# Patient Record
Sex: Female | Born: 1990 | Race: White | Hispanic: No | Marital: Married | Smoking: Never smoker
Health system: Southern US, Community
[De-identification: ages and names within clinical notes are randomized; demographics above are authoritative.]

## PROBLEM LIST (undated history)

## (undated) DIAGNOSIS — Z789 Other specified health status: Secondary | ICD-10-CM

## (undated) DIAGNOSIS — I370 Nonrheumatic pulmonary valve stenosis: Secondary | ICD-10-CM

## (undated) HISTORY — DX: Nonrheumatic pulmonary valve stenosis: I37.0

## (undated) HISTORY — PX: NO PAST SURGERIES: SHX2092

---
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
## 2017-03-11 ENCOUNTER — Encounter: Payer: Self-pay | Admitting: Emergency Medicine

## 2017-03-11 ENCOUNTER — Emergency Department
Admission: EM | Admit: 2017-03-11 | Discharge: 2017-03-11 | Disposition: A | Discharge status: Home / Self Care | Payer: Self-pay | Attending: Emergency Medicine | Admitting: Emergency Medicine

## 2017-03-11 ENCOUNTER — Emergency Department: Payer: Self-pay

## 2017-03-11 DIAGNOSIS — R102 Pelvic and perineal pain unspecified side: Secondary | ICD-10-CM

## 2017-03-11 DIAGNOSIS — N83202 Unspecified ovarian cyst, left side: Secondary | ICD-10-CM

## 2017-03-11 DIAGNOSIS — K529 Noninfective gastroenteritis and colitis, unspecified: Secondary | ICD-10-CM

## 2017-03-11 DIAGNOSIS — N83209 Unspecified ovarian cyst, unspecified side: Secondary | ICD-10-CM

## 2017-03-11 LAB — URINALYSIS, COMPLETE (UACMP) WITH MICROSCOPIC
BACTERIA UA: NONE SEEN
Glucose, UA: NEGATIVE mg/dL
Hgb urine dipstick: NEGATIVE
Ketones, ur: NEGATIVE mg/dL
Leukocytes, UA: NEGATIVE
Nitrite: NEGATIVE
PH: 7 (ref 5.0–8.0)
Specific Gravity, Urine: 1.02 (ref 1.005–1.030)

## 2017-03-11 LAB — CBC WITH DIFFERENTIAL/PLATELET
Basophils Absolute: 0 10*3/uL (ref 0–0.1)
Basophils Relative: 0 %
EOS ABS: 0 10*3/uL (ref 0–0.7)
EOS PCT: 0 %
HCT: 39.1 % (ref 35.0–47.0)
Hemoglobin: 13.6 g/dL (ref 12.0–16.0)
LYMPHS ABS: 0.6 10*3/uL — AB (ref 1.0–3.6)
LYMPHS PCT: 7 %
MCH: 30 pg (ref 26.0–34.0)
MCHC: 34.7 g/dL (ref 32.0–36.0)
MCV: 86.5 fL (ref 80.0–100.0)
MONO ABS: 0.6 10*3/uL (ref 0.2–0.9)
MONOS PCT: 8 %
Neutro Abs: 6.6 10*3/uL — ABNORMAL HIGH (ref 1.4–6.5)
Neutrophils Relative %: 85 %
PLATELETS: 196 10*3/uL (ref 150–440)
RBC: 4.52 MIL/uL (ref 3.80–5.20)
RDW: 13.2 % (ref 11.5–14.5)
WBC: 7.9 10*3/uL (ref 3.6–11.0)

## 2017-03-11 LAB — POCT PREGNANCY, URINE: Preg Test, Ur: NEGATIVE

## 2017-03-11 LAB — COMPREHENSIVE METABOLIC PANEL
ALBUMIN: 4.8 g/dL (ref 3.5–5.0)
ALT: 12 U/L — AB (ref 14–54)
AST: 20 U/L (ref 15–41)
Alkaline Phosphatase: 59 U/L (ref 38–126)
Anion gap: 8 (ref 5–15)
BUN: 10 mg/dL (ref 6–20)
CHLORIDE: 100 mmol/L — AB (ref 101–111)
CO2: 25 mmol/L (ref 22–32)
CREATININE: 0.92 mg/dL (ref 0.44–1.00)
Calcium: 9 mg/dL (ref 8.9–10.3)
GFR calc Af Amer: >60 mL/min (ref >60)
GFR calc non Af Amer: >60 mL/min (ref >60)
GLUCOSE: 125 mg/dL — AB (ref 65–99)
POTASSIUM: 3.1 mmol/L — AB (ref 3.5–5.1)
SODIUM: 133 mmol/L — AB (ref 135–145)
Total Bilirubin: 0.7 mg/dL (ref 0.3–1.2)
Total Protein: 7.6 g/dL (ref 6.5–8.1)

## 2017-03-11 LAB — LACTIC ACID, PLASMA: LACTIC ACID, VENOUS: 1.2 mmol/L (ref 0.5–1.9)

## 2017-03-11 MED ORDER — SODIUM CHLORIDE 0.9 % IV BOLUS (SEPSIS)
1000.0000 mL | Freq: Once | INTRAVENOUS | Status: AC
  Administered 2017-03-11: 1000 mL via INTRAVENOUS

## 2017-03-11 MED ORDER — IOPAMIDOL (ISOVUE-300) INJECTION 61%
30.0000 mL | Freq: Once | INTRAVENOUS | Status: AC
  Administered 2017-03-11: 30 mL via ORAL

## 2017-03-11 MED ORDER — IOPAMIDOL (ISOVUE-300) INJECTION 61%
100.0000 mL | Freq: Once | INTRAVENOUS | Status: AC | PRN
  Administered 2017-03-11: 100 mL via INTRAVENOUS

## 2017-03-11 MED ORDER — MORPHINE SULFATE (PF) 4 MG/ML IV SOLN: 4.0000 mg | Freq: Once | INTRAVENOUS | Status: DC

## 2017-03-11 MED ORDER — ACETAMINOPHEN 325 MG PO TABS
650.0000 mg | ORAL_TABLET | Freq: Once | ORAL | Status: AC
  Administered 2017-03-11: 650 mg via ORAL
  Filled 2017-03-11: qty 2

## 2017-03-11 NOTE — ED Notes (Signed)
Pt c/o abdominal pain and cramping that began last night. Pt denies any urinary symptoms, cough. Pt states pain radiates to bilateral flank area. Last normal BM X 6 days ago then followed by diarrhea. Pt alert and oriented X4, active, cooperative, pt in NAD. RR even and unlabored, color WNL.

## 2017-03-11 NOTE — ED Provider Notes (Addendum)
[facility] Emergency Department Provider Note  ____________________________________________   I have reviewed the triage vital signs and the nursing notes.   HISTORY  Chief Complaint Diarrhea and Abdominal Pain    HPI Sally Hall is a 26 y.o. female who presents today complaining of lower abdominal discomfort that started this morning. Patient states she's had diarrhea for the last 2-3 days, nonbloody and non-melanotic. It was preceded by a few days of constipation. She has had no vomiting, she has had decreased appetite today. She's never had any abdominal surgeries. The pain is diffuse and cramping on both sides. No other radiation. It is a sharp pain. It is similar to her menstrual. Pain but worse. She is on her menses at this time. She denies any vaginal discharge. She states she has had copious diarrhea over the last few days but is getting better today. She did not notice fever until she got here. She has had no URI symptoms nausea or vomiting or other possible sources of fever. She denies dysuria. She denies vaginal discharge.  She declines pain medication. She is otherwise healthy.   History reviewed. No pertinent past medical history.  There are no active problems to display for this patient.   History reviewed. No pertinent surgical history.  Prior to Admission medications   Not on File    Allergies Penicillins  No family history on file.  Social History Social History  Substance Use Topics  . Smoking status: Never Smoker  . Smokeless tobacco: Never Used  . Alcohol use No    Review of Systems Constitutional: A fever here only was afebrile prior to coming in Eyes: No visual changes. ENT: No sore throat. No stiff neck no neck pain Cardiovascular: Denies chest pain. Respiratory: Denies shortness of breath. Gastrointestinal:   no vomiting.  Positive diarrhea see history of present illness Genitourinary: Negative for  dysuria. Musculoskeletal: Negative lower extremity swelling Skin: Negative for rash. Neurological: Negative for severe headaches, focal weakness or numbness.   ____________________________________________   PHYSICAL EXAM:  VITAL SIGNS: ED Triage Vitals [03/11/17 1639]  Enc Vitals Group     BP 115/76     Pulse Rate (!) 128     Resp 18     Temp (!) 102.1 F (38.9 C)     Temp Source Oral     SpO2 100 %     Weight 148 lb (67.1 kg)     Height 5\' 7"  (1.702 m)     Head Circumference      Peak Flow      Pain Score 3     Pain Loc      Pain Edu?      Excl. in GC?     Constitutional: Alert and oriented. Well appearing and in no acute distress. Eyes: Conjunctivae are normal Head: Atraumatic HEENT: No congestion/rhinnorhea. Mucous membranes are moist.  Oropharynx non-erythematous Neck:   Nontender with no meningismus, no masses, no stridor Cardiovascular: Normal rate, regular rhythm. Grossly normal heart sounds.  Good peripheral circulation. Respiratory: Normal respiratory effort.  No retractions. Lungs CTAB. Abdominal: Soft and diffuse lower abdominal discomfort left and right, no guarding no rebound Back:  There is no focal tenderness or step off.  there is no midline tenderness there are no lesions noted. there is no CVA tenderness Musculoskeletal: No lower extremity tenderness, no upper extremity tenderness. No joint effusions, no DVT signs strong distal pulses no edema Neurologic:  Normal speech and language. No gross focal neurologic deficits  are appreciated.  Skin:  Skin is warm, dry and intact. No rash noted. Psychiatric: Mood and affect are normal. Speech and behavior are normal.  ____________________________________________   LABS (all labs ordered are listed, but only abnormal results are displayed)  Labs Reviewed  CBC WITH DIFFERENTIAL/PLATELET - Abnormal; Notable for the following:       Result Value   Neutro Abs 6.6 (*)    Lymphs Abs 0.6 (*)    All other  components within normal limits  LACTIC ACID, PLASMA  LACTIC ACID, PLASMA  COMPREHENSIVE METABOLIC PANEL  URINALYSIS, COMPLETE (UACMP) WITH MICROSCOPIC  POC URINE PREG, ED  POCT PREGNANCY, URINE   ____________________________________________  EKG  I personally interpreted any EKGs ordered by me or triage  ____________________________________________  RADIOLOGY  I reviewed any imaging ordered by me or triage that were performed during my shift and, if possible, patient and/or family made aware of any abnormal findings. ____________________________________________   PROCEDURES  Procedure(s) performed: None  Procedures  Critical Care performed: None  ____________________________________________   INITIAL IMPRESSION / ASSESSMENT AND PLAN / ED COURSE  Pertinent labs & imaging results that were available during my care of the patient were reviewed by me and considered in my medical decision making (see chart for details).  Patient with diarrhea and fever and lower abdominal pain. She is on her menstrual period. She is not pregnant. I did offer her pain medication she'll prefer to decline. We will see what her urine and blood work show which will hopefully help with her differential. Low suspicion for PID given diarrheal and GI symptoms. No recent antibiotics or international travel or camping. Low suspicion for Giardia or C. difficile. We will give her IV fluids continue to monitor her and determine whether imaging is necessary. Patient would prefer to defer pelvic exam at this time  ----------------------------------------- 9:10 PM on 03/11/2017 -----------------------------------------  Abdomen benign. Pain-free at this time. Heart rate is coming down somewhat. Patient states she is very anxious around doctors. She feels this is why her heart rate has been up. We will continue to assess. Patient heart rate this time is in the 80s. We did discuss with her the risks benefits and  alternative to pelvic exam and she declines. She understands there is always a risk that she could've PID which could cause infertility. However she feels that she does not wish to be done in the emergency room and she declines. I also talked about her ovarian cyst. Personally I think this is much more likely to be an incidental finding with a diarrheal illness with a fever however given the size of the cyst, I did order an ultrasound to evaluate for possible torsion. I have very low suspicion for torsion given the gradual onset of pain in the context of diarrhea and fever however 857 m systems of the borderline of risk for that. Patient states she would feel comfortable being further evaluated which we are doing. Without pelvic exam, she knows, does somewhat limit my ability to determine that but she does not have focal left-sided pain she generalized cramping lower abdominal pain with fever and diarrhea.  ----------------------------------------- 10:41 PM on 03/11/2017 -----------------------------------------  Discussed with Dr. Feliberto GottronSchermerhorn who agrees with management and discharge and follow-up.    ____________________________________________   FINAL CLINICAL IMPRESSION(S) / ED DIAGNOSES  Final diagnoses:  None      This chart was dictated using voice recognition software.  Despite best efforts to proofread,  errors can occur which can change  meaning.      Jeanmarie Plant, MD 03/11/17 1739    Jeanmarie Plant, MD 03/11/17 1744    Jeanmarie Plant, MD 03/11/17 2111    Jeanmarie Plant, MD 03/11/17 2242

## 2017-03-11 NOTE — Discharge Instructions (Signed)
At this time, we are reassured by your findings. You have an ovarian cyst is no evidence that there is a torsion. However, we ask you to follow closely with OB to make sure this is a simple cyst and not something else. In addition, without a pelvic exam, there is a limited to what I can diagnose as we discussed. If you feel worse in any way including fever, vomiting, severe abdominal pain, lightheadedness or you have any other concerns please return immediately to the emergency room. Drink plenty of fluids.

## 2017-03-11 NOTE — ED Notes (Signed)
Pt back from CT

## 2017-03-11 NOTE — ED Notes (Signed)
Patient denied Pelvic exam by MD

## 2017-03-11 NOTE — ED Triage Notes (Signed)
Pt with abd pain and diarrhea.

## 2017-07-29 IMAGING — US US PELVIS COMPLETE
1 series · 13 of 25 positions shown · non-contrast
Comparison: CT abdomen/ pelvis earlier this day.

CLINICAL DATA: Pelvic pain for 1 day.  Ovarian cyst on CT.

EXAM:
TRANSABDOMINAL ULTRASOUND OF PELVIS
DOPPLER ULTRASOUND OF OVARIES
TECHNIQUE: Transabdominal ultrasound examination of the pelvis was performed
including evaluation of the uterus, ovaries, adnexal regions, and
pelvic cul-de-sac. Patient refused transvaginal exam.
Color and duplex Doppler ultrasound was utilized to evaluate blood
flow to the ovaries.

[Series 1: us pelvis complete · 0.22mm/px · 13 of 73 slices shown]
[im 1/73]
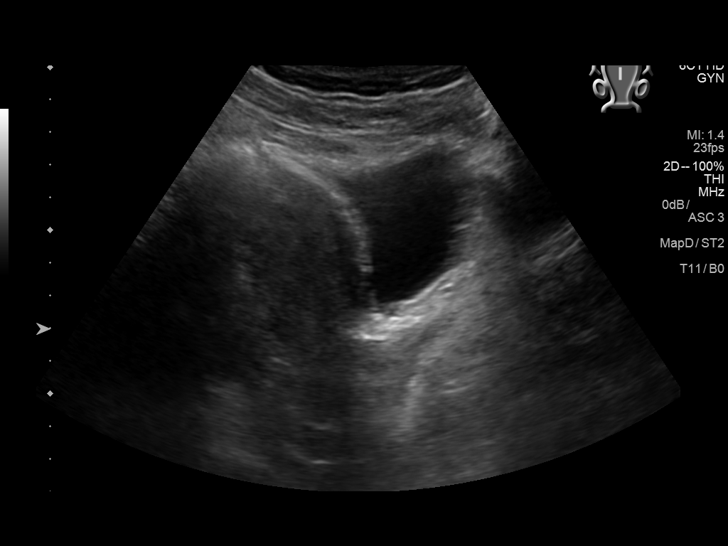
[im 7/73]
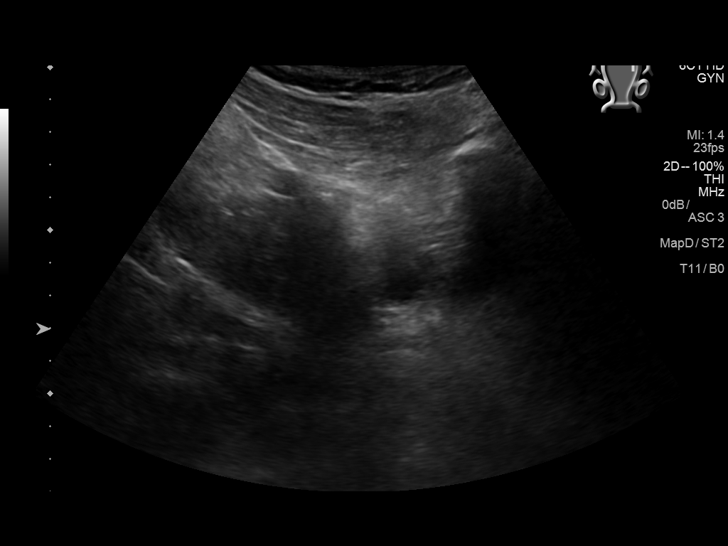
[im 13/73]
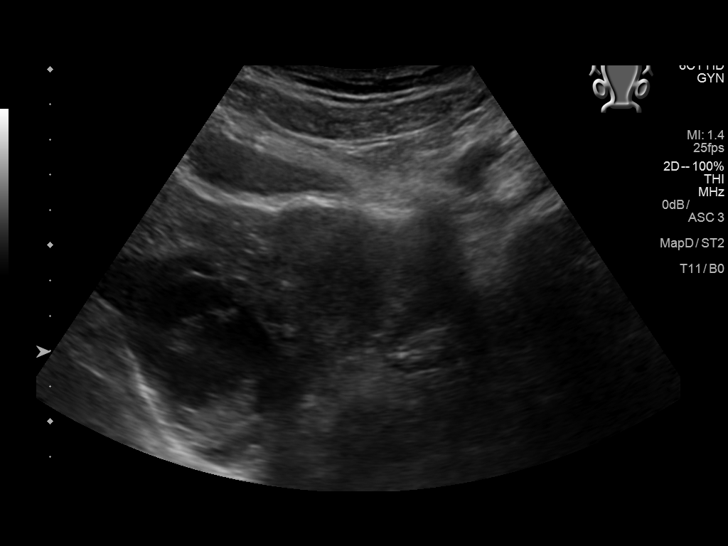
[im 19/73]
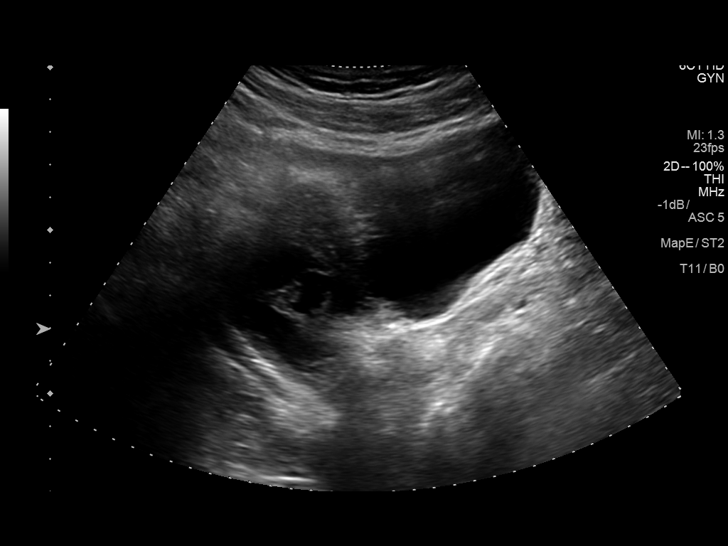
[im 25/73]
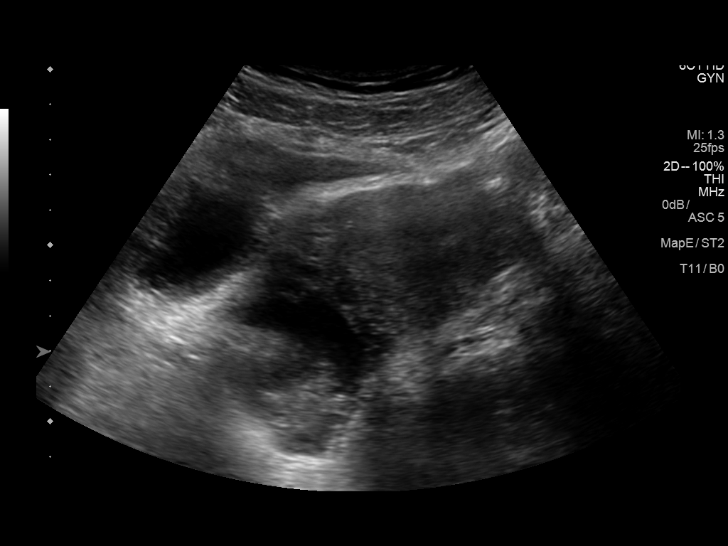
[im 31/73]
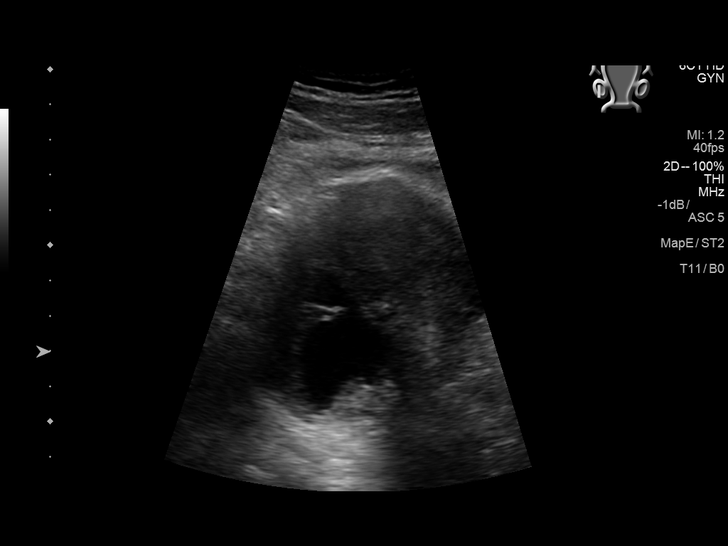
[im 37/73]
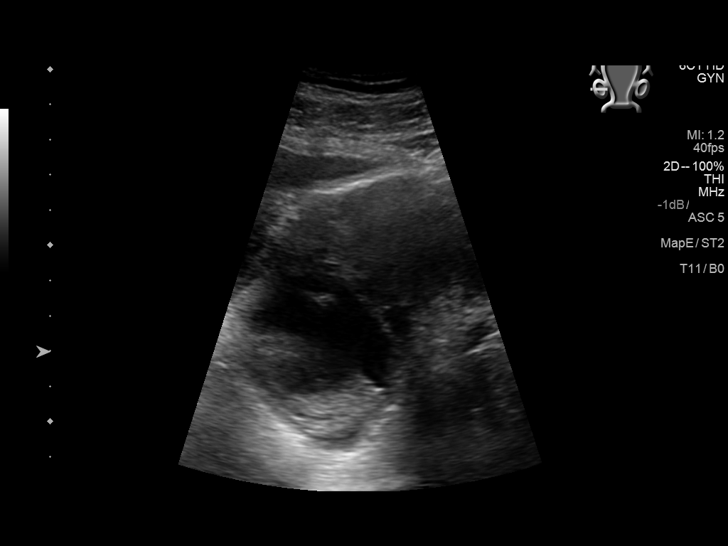
[im 43/73]
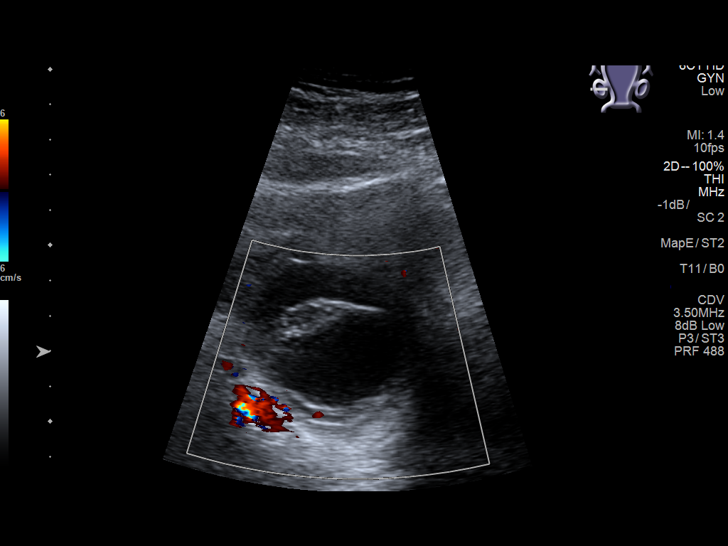
[im 49/73]
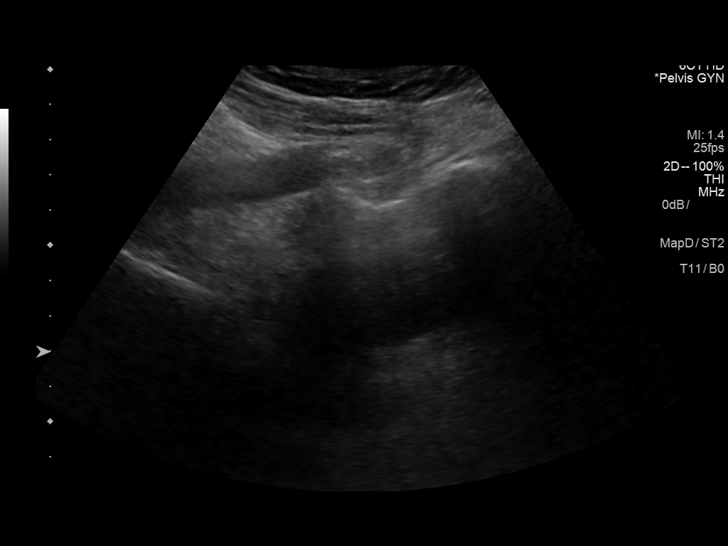
[im 55/73]
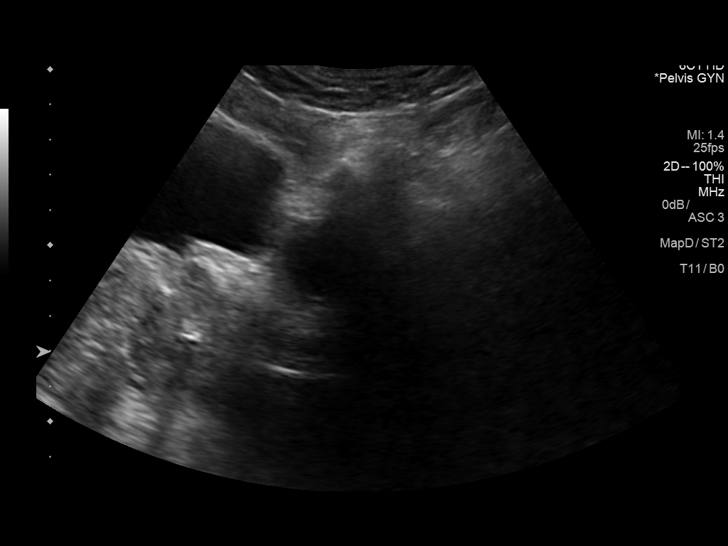
[im 61/73]
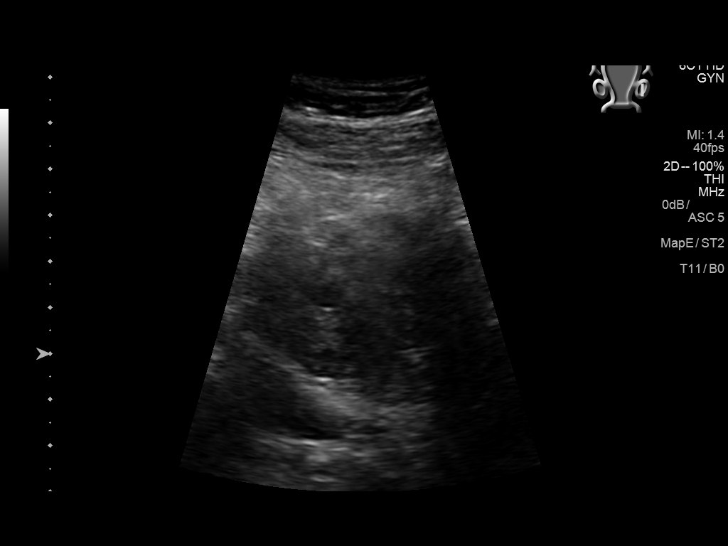
[im 67/73]
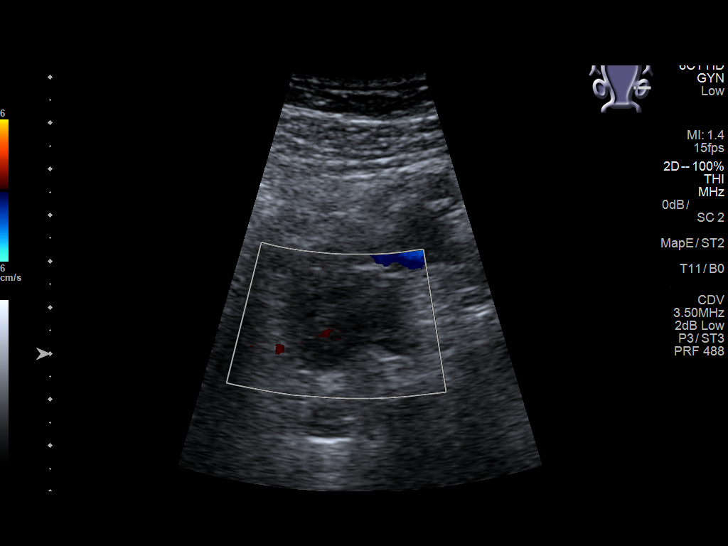
[im 73/73]
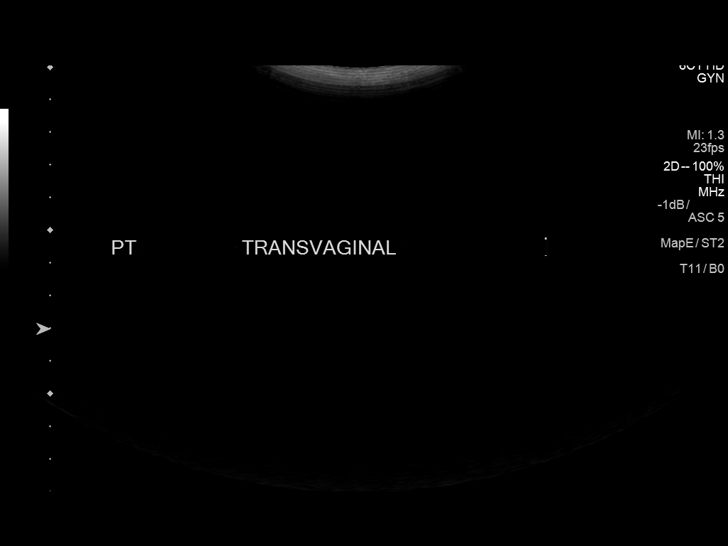

[13 of 25 positions shown; findings below may reference images not displayed]

FINDINGS: Uterus

Measurements: 7.3 x 3.6 x 4.9 cm. No fibroids or other mass
visualized.

Endometrium

Thickness: 8.8 mm, normal. No focal abnormality visualized.

Right ovary

Measurements: 4.2 x 5.5 x 3.8 cm.. There is a complex cyst measuring
3.8 x 4.1 x 4.4 cm, versus multiple adjacent cysts. If 1 lesion
there thick internal septa, no definite septal blood flow. Blood
flow seen to the ovarian parenchyma.

Left ovary

Measurements: 3.2 x 1.8 x 2.0 cm. Normal appearance/no adnexal mass.

Pulsed Doppler evaluation demonstrates normal low-resistance
arterial and venous waveforms in both ovaries.

No pelvic free fluid is visualized.
IMPRESSION: 1. Complex right ovarian cysts versus less likely adjacent cysts
measuring 4.4 cm. There are thick internal septa. Differential
considerations include resolving hemorrhagic cyst with clot
retraction versus ovarian neoplasm. Short-interval follow up
ultrasound in 6-12 weeks is recommended, preferably during the week
following the patient's normal menses.
2. No evidence of torsion.  Normal blood flow to both ovaries.

## 2019-09-23 NOTE — L&D Delivery Note (Signed)
Delivery Note  2230 In room to see patient, wishes to continue pushing. Effective coached maternal pushing efforts in various positions.   Spontaneous vaginal birth of liveborn female infant at 2333 in right occiput anterior position. Single loose nuchal cord reduced on perineum. Infant immediately to maternal abdomen, 10 minutes of delayed cord clamping, skin to skin and three (3) vessel cord. APGARs: 8, 9. Weight pending. Receiving nurse and NNP present at bedside.   IM Pitocin given, see chart. Spontaneous delivery of intact placenta at 2349. First degree perineal laceration repaired with 3-0 vicryl rapide under local anesthesia. Asymmetrical bilateral labial lacerations hemostatic, unrepaired.Uterus firm. Rubra small. EBL: 250 ml.     Initiate routine postpartum care and orders. Mom to postpartum.  Baby to Couplet care / Skin to Skin.  FOB present at bedside and overjoyed with the birth of "Sally Hall".  Routine postpartum care and orders.    Serafina Royals, CNM [facility], [facility] 07/16/2020, 12:19 AM

## 2019-12-19 ENCOUNTER — Ambulatory Visit (INDEPENDENT_AMBULATORY_CARE_PROVIDER_SITE_OTHER): Payer: Self-pay | Admitting: Certified Nurse Midwife

## 2019-12-19 ENCOUNTER — Other Ambulatory Visit: Payer: Self-pay

## 2019-12-19 ENCOUNTER — Encounter: Payer: Self-pay | Admitting: Certified Nurse Midwife

## 2019-12-19 VITALS — BP 116/72 | HR 101 | Ht 67.0 in | Wt 152.1 lb

## 2019-12-19 DIAGNOSIS — N926 Irregular menstruation, unspecified: Secondary | ICD-10-CM

## 2019-12-19 LAB — POCT URINE PREGNANCY: Preg Test, Ur: POSITIVE — AB

## 2019-12-19 NOTE — Patient Instructions (Signed)

## 2019-12-19 NOTE — Progress Notes (Signed)
Subjective:    Sally Hall is a 29 y.o. female who presents for evaluation of amenorrhea. She believes she could be pregnant. Pregnancy is desired. Sexual Activity: single partner, contraception: none. Current symptoms also include: nausea and gas. Last period was normal.   Patient's last menstrual period was 10/10/2019 (exact date). The following portions of the patient's history were reviewed and updated as appropriate: allergies, current medications, past family history, past medical history, past social history, past surgical history and problem list.  Review of Systems Pertinent items are noted in HPI.     Objective:    BP 116/72   Pulse (!) 101   Ht 5\' 7"  (1.702 m)   Wt 152 lb 2 oz (69 kg)   LMP 10/10/2019 (Exact Date)   BMI 23.83 kg/m  General: alert, cooperative, appears stated age and no acute distress    Lab Review Urine HCG: positive    Assessment:    Absence of menstruation.     Plan:   Positive: EDC: 10/02/20. Briefly discussed pre-natal care options. MD pr Midwifery care discussed.  Encouraged well-balanced diet, plenty of rest when needed, pre-natal vitamins daily and walking for exercise. Discussed self-help for nausea, avoiding OTC medications until consulting provider or pharmacist, other than Tylenol as needed, minimal caffeine (1-2 cups daily) and avoiding alcohol. She will schedule her dating u/s 1 wk, nurse visit @ [redacted] wks pregnant and her initial NOB visit @ 12-[redacted] wks pregnant.  Feel free to call with any questions.   11/30/20, CNM

## 2019-12-22 ENCOUNTER — Encounter: Payer: Self-pay | Admitting: Obstetrics and Gynecology

## 2019-12-22 ENCOUNTER — Other Ambulatory Visit: Payer: Self-pay

## 2019-12-22 ENCOUNTER — Ambulatory Visit: Payer: Self-pay

## 2019-12-22 DIAGNOSIS — Z3A11 11 weeks gestation of pregnancy: Secondary | ICD-10-CM

## 2019-12-22 DIAGNOSIS — Z3689 Encounter for other specified antenatal screening: Secondary | ICD-10-CM

## 2019-12-22 DIAGNOSIS — N926 Irregular menstruation, unspecified: Secondary | ICD-10-CM

## 2019-12-30 ENCOUNTER — Ambulatory Visit (INDEPENDENT_AMBULATORY_CARE_PROVIDER_SITE_OTHER): Payer: Self-pay | Admitting: Certified Nurse Midwife

## 2019-12-30 ENCOUNTER — Other Ambulatory Visit: Payer: Self-pay

## 2019-12-30 VITALS — BP 123/75 | HR 118 | Ht 67.0 in | Wt 152.8 lb

## 2019-12-30 DIAGNOSIS — Z0283 Encounter for blood-alcohol and blood-drug test: Secondary | ICD-10-CM

## 2019-12-30 DIAGNOSIS — Z3401 Encounter for supervision of normal first pregnancy, first trimester: Secondary | ICD-10-CM

## 2019-12-30 DIAGNOSIS — Z113 Encounter for screening for infections with a predominantly sexual mode of transmission: Secondary | ICD-10-CM

## 2019-12-30 LAB — OB RESULTS CONSOLE GC/CHLAMYDIA: Gonorrhea: NEGATIVE

## 2019-12-30 LAB — OB RESULTS CONSOLE VARICELLA ZOSTER ANTIBODY, IGG: Varicella: IMMUNE

## 2019-12-30 NOTE — Patient Instructions (Signed)
WHAT OB PATIENTS CAN EXPECT   Confirmation of pregnancy and ultrasound ordered if medically indicated-[redacted] weeks gestation  New OB (NOB) intake with nurse and New OB (NOB) labs- [redacted] weeks gestation  New OB (NOB) physical examination with provider- 11/[redacted] weeks gestation  Flu vaccine-[redacted] weeks gestation  Anatomy scan-[redacted] weeks gestation  Glucose tolerance test, blood work to test for anemia, T-dap vaccine-[redacted] weeks gestation  Vaginal swabs/cultures-STD/Group B strep-[redacted] weeks gestation  Appointments every 4 weeks until 28 weeks  Every 2 weeks from 28 weeks until 36 weeks  Weekly visits from 36 weeks until delivery  Second Trimester of Pregnancy  The second trimester is from week 14 through week 27 (month 4 through 6). This is often the time in pregnancy that you feel your best. Often times, morning sickness has lessened or quit. You may have more energy, and you may get hungry more often. Your unborn baby is growing rapidly. At the end of the sixth month, he or she is about 9 inches long and weighs about 1 pounds. You will likely feel the baby move between 18 and 20 weeks of pregnancy. Follow these instructions at home: Medicines  Take over-the-counter and prescription medicines only as told by your doctor. Some medicines are safe and some medicines are not safe during pregnancy.  Take a prenatal vitamin that contains at least 600 micrograms (mcg) of folic acid.  If you have trouble pooping (constipation), take medicine that will make your stool soft (stool softener) if your doctor approves. Eating and drinking   Eat regular, healthy meals.  Avoid raw meat and uncooked cheese.  If you get low calcium from the food you eat, talk to your doctor about taking a daily calcium supplement.  Avoid foods that are high in fat and sugars, such as fried and sweet foods.  If you feel sick to your stomach (nauseous) or throw up (vomit): ? Eat 4 or 5 small meals a day instead of 3 large  meals. ? Try eating a few soda crackers. ? Drink liquids between meals instead of during meals.  To prevent constipation: ? Eat foods that are high in fiber, like fresh fruits and vegetables, whole grains, and beans. ? Drink enough fluids to keep your pee (urine) clear or pale yellow. Activity  Exercise only as told by your doctor. Stop exercising if you start to have cramps.  Do not exercise if it is too hot, too humid, or if you are in a place of great height (high altitude).  Avoid heavy lifting.  Wear low-heeled shoes. Sit and stand up straight.  You can continue to have sex unless your doctor tells you not to. Relieving pain and discomfort  Wear a good support bra if your breasts are tender.  Take warm water baths (sitz baths) to soothe pain or discomfort caused by hemorrhoids. Use hemorrhoid cream if your doctor approves.  Rest with your legs raised if you have leg cramps or low back pain.  If you develop puffy, bulging veins (varicose veins) in your legs: ? Wear support hose or compression stockings as told by your doctor. ? Raise (elevate) your feet for 15 minutes, 3-4 times a day. ? Limit salt in your food. Prenatal care  Write down your questions. Take them to your prenatal visits.  Keep all your prenatal visits as told by your doctor. This is important. Safety  Wear your seat belt when driving.  Make a list of emergency phone numbers, including numbers for family, friends, the  hospital, and police and fire departments. General instructions  Ask your doctor about the right foods to eat or for help finding a counselor, if you need these services.  Ask your doctor about local prenatal classes. Begin classes before month 6 of your pregnancy.  Do not use hot tubs, steam rooms, or saunas.  Do not douche or use tampons or scented sanitary pads.  Do not cross your legs for long periods of time.  Visit your dentist if you have not done so. Use a soft toothbrush  to brush your teeth. Floss gently.  Avoid all smoking, herbs, and alcohol. Avoid drugs that are not approved by your doctor.  Do not use any products that contain nicotine or tobacco, such as cigarettes and e-cigarettes. If you need help quitting, ask your doctor.  Avoid cat litter boxes and soil used by cats. These carry germs that can cause birth defects in the baby and can cause a loss of your baby (miscarriage) or stillbirth. Contact a doctor if:  You have mild cramps or pressure in your lower belly.  You have pain when you pee (urinate).  You have bad smelling fluid coming from your vagina.  You continue to feel sick to your stomach (nauseous), throw up (vomit), or have watery poop (diarrhea).  You have a nagging pain in your belly area.  You feel dizzy. Get help right away if:  You have a fever.  You are leaking fluid from your vagina.  You have spotting or bleeding from your vagina.  You have severe belly cramping or pain.  You lose or gain weight rapidly.  You have trouble catching your breath and have chest pain.  You notice sudden or extreme puffiness (swelling) of your face, hands, ankles, feet, or legs.  You have not felt the baby move in over an hour.  You have severe headaches that do not go away when you take medicine.  You have trouble seeing. Summary  The second trimester is from week 14 through week 27 (months 4 through 6). This is often the time in pregnancy that you feel your best.  To take care of yourself and your unborn baby, you will need to eat healthy meals, take medicines only if your doctor tells you to do so, and do activities that are safe for you and your baby.  Call your doctor if you get sick or if you notice anything unusual about your pregnancy. Also, call your doctor if you need help with the right food to eat, or if you want to know what activities are safe for you. This information is not intended to replace advice given to you by  your health care provider. Make sure you discuss any questions you have with your health care provider. Document Revised: 12/31/2018 Document Reviewed: 10/14/2016 Elsevier Patient Education  2020 [employer]. Prenatal Care Prenatal care is health care during pregnancy. It helps you and your unborn baby (fetus) stay as healthy as possible. Prenatal care may be provided by a midwife, a family practice health care provider, or a childbirth and pregnancy specialist (obstetrician). How does this affect me? During pregnancy, you will be closely monitored for any new conditions that might develop. To lower your risk of pregnancy complications, you and your health care provider will talk about any underlying conditions you have. How does this affect my baby? Early and consistent prenatal care increases the chance that your baby will be healthy during pregnancy. Prenatal care lowers the risk that your  baby will be:  Born early (prematurely).  Smaller than expected at birth (small for gestational age). What can I expect at the first prenatal care visit? Your first prenatal care visit will likely be the longest. You should schedule your first prenatal care visit as soon as you know that you are pregnant. Your first visit is a good time to talk about any questions or concerns you have about pregnancy. At your visit, you and your health care provider will talk about:  Your medical history, including: ? Any past pregnancies. ? Your family's medical history. ? The baby's father's medical history. ? Any long-term (chronic) health conditions you have and how you manage them. ? Any surgeries or procedures you have had. ? Any current over-the-counter or prescription medicines, herbs, or supplements you are taking.  Other factors that could pose a risk to your baby, including:  Your home setting and your stress levels, including: ? Exposure to abuse or violence. ? Household financial strain. ? Mental  health conditions you have.  Your daily health habits, including diet and exercise. Your health care provider will also:  Measure your weight, height, and blood pressure.  Do a physical exam, including a pelvic and breast exam.  Perform blood tests and urine tests to check for: ? Urinary tract infection. ? Sexually transmitted infections (STIs). ? Low iron levels in your blood (anemia). ? Blood type and certain proteins on red blood cells (Rh antibodies). ? Infections and immunity to viruses, such as hepatitis B and rubella. ? HIV (human immunodeficiency virus).  Do an ultrasound to confirm your baby's growth and development and to help predict your estimated due date (EDD). This ultrasound is done with a probe that is inserted into the vagina (transvaginal ultrasound).  Discuss your options for genetic screening.  Give you information about how to keep yourself and your baby healthy, including: ? Nutrition and taking vitamins. ? Physical activity. ? How to manage pregnancy symptoms such as nausea and vomiting (morning sickness). ? Infections and substances that may be harmful to your baby and how to avoid them. ? Food safety. ? Dental care. ? Working. ? Travel. ? Warning signs to watch for and when to call your health care provider. How often will I have prenatal care visits? After your first prenatal care visit, you will have regular visits throughout your pregnancy. The visit schedule is often as follows:  Up to week 28 of pregnancy: once every 4 weeks.  28-36 weeks: once every 2 weeks.  After 36 weeks: every week until delivery. Some women may have visits more or less often depending on any underlying health conditions and the health of the baby. Keep all follow-up and prenatal care visits as told by your health care provider. This is important. What happens during routine prenatal care visits? Your health care provider will:  Measure your weight and blood  pressure.  Check for fetal heart sounds.  Measure the height of your uterus in your abdomen (fundal height). This may be measured starting around week 20 of pregnancy.  Check the position of your baby inside your uterus.  Ask questions about your diet, sleeping patterns, and whether you can feel the baby move.  Review warning signs to watch for and signs of labor.  Ask about any pregnancy symptoms you are having and how you are dealing with them. Symptoms may include: ? Headaches. ? Nausea and vomiting. ? Vaginal discharge. ? Swelling. ? Fatigue. ? Constipation. ? Any discomfort, including back  or pelvic pain. Make a list of questions to ask your health care provider at your routine visits. What tests might I have during prenatal care visits? You may have blood, urine, and imaging tests throughout your pregnancy, such as:  Urine tests to check for glucose, protein, or signs of infection.  Glucose tests to check for a form of diabetes that can develop during pregnancy (gestational diabetes mellitus). This is usually done around week 24 of pregnancy.  An ultrasound to check your baby's growth and development and to check for birth defects. This is usually done around week 20 of pregnancy.  A test to check for group B strep (GBS) infection. This is usually done around week 36 of pregnancy.  Genetic testing. This may include blood or imaging tests, such as an ultrasound. Some genetic tests are done during the first trimester and some are done during the second trimester. What else can I expect during prenatal care visits? Your health care provider may recommend getting certain vaccines during pregnancy. These may include:  A yearly flu shot (annual influenza vaccine). This is especially important if you will be pregnant during flu season.  Tdap (tetanus, diphtheria, pertussis) vaccine. Getting this vaccine during pregnancy can protect your baby from whooping cough (pertussis) after  birth. This vaccine may be recommended between weeks 27 and 36 of pregnancy. Later in your pregnancy, your health care provider may give you information about:  Childbirth and breastfeeding classes.  Choosing a health care provider for your baby.  Umbilical cord banking.  Breastfeeding.  Birth control after your baby is born.  The hospital labor and delivery unit and how to tour it.  Registering at the hospital before you go into labor. Where to find more information  Office on Women's Health: LegalWarrants.gl  American Pregnancy Association: americanpregnancy.org  March of Dimes: marchofdimes.org Summary  Prenatal care helps you and your baby stay as healthy as possible during pregnancy.  Your first prenatal care visit will most likely be the longest.  You will have visits and tests throughout your pregnancy to monitor your health and your baby's health.  Bring a list of questions to your visits to ask your health care provider.  Make sure to keep all follow-up and prenatal care visits with your health care provider. This information is not intended to replace advice given to you by your health care provider. Make sure you discuss any questions you have with your health care provider. Document Revised: 12/29/2018 Document Reviewed: 09/07/2017 Elsevier Patient Education  2020 [employer]. How a Baby Grows During Pregnancy  Pregnancy begins when a female's sperm enters a female's egg (fertilization). Fertilization usually happens in one of the tubes (fallopian tubes) that connect the ovaries to the womb (uterus). The fertilized egg moves down the fallopian tube to the uterus. Once it reaches the uterus, it implants into the lining of the uterus and begins to grow. For the first 10 weeks, the fertilized egg is called an embryo. After 10 weeks, it is called a fetus. As the fetus continues to grow, it receives oxygen and nutrients through tissue (placenta) that grows to support  the developing baby. The placenta is the life support system for the baby. It provides oxygen and nutrition and removes waste. Learning as much as you can about your pregnancy and how your baby is developing can help you enjoy the experience. It can also make you aware of when there might be a problem and when to ask questions. How long  does a typical pregnancy last? A pregnancy usually lasts 280 days, or about 40 weeks. Pregnancy is divided into three periods of growth, also called trimesters:  First trimester: 0-12 weeks.  Second trimester: 13-27 weeks.  Third trimester: 28-40 weeks. The day when your baby is ready to be born (full term) is your estimated date of delivery. How does my baby develop month by month? First month  The fertilized egg attaches to the inside of the uterus.  Some cells will form the placenta. Others will form the fetus.  The arms, legs, brain, spinal cord, lungs, and heart begin to develop.  At the end of the first month, the heart begins to beat. Second month  The bones, inner ear, eyelids, hands, and feet form.  The genitals develop.  By the end of 8 weeks, all major organs are developing. Third month  All of the internal organs are forming.  Teeth develop below the gums.  Bones and muscles begin to grow. The spine can flex.  The skin is transparent.  Fingernails and toenails begin to form.  Arms and legs continue to grow longer, and hands and feet develop.  The fetus is about 3 inches (7.6 cm) long. Fourth month  The placenta is completely formed.  The external sex organs, neck, outer ear, eyebrows, eyelids, and fingernails are formed.  The fetus can hear, swallow, and move its arms and legs.  The kidneys begin to produce urine.  The skin is covered with a white, waxy coating (vernix) and very fine hair (lanugo). Fifth month  The fetus moves around more and can be felt for the first time (quickening).  The fetus starts to sleep  and wake up and may begin to suck its finger.  The nails grow to the end of the fingers.  The organ in the digestive system that makes bile (gallbladder) functions and helps to digest nutrients.  If your baby is a girl, eggs are present in her ovaries. If your baby is a boy, testicles start to move down into his scrotum. Sixth month  The lungs are formed.  The eyes open. The brain continues to develop.  Your baby has fingerprints and toe prints. Your baby's hair grows thicker.  At the end of the second trimester, the fetus is about 9 inches (22.9 cm) long. Seventh month  The fetus kicks and stretches.  The eyes are developed enough to sense changes in light.  The hands can make a grasping motion.  The fetus responds to sound. Eighth month  All organs and body systems are fully developed and functioning.  Bones harden, and taste buds develop. The fetus may hiccup.  Certain areas of the brain are still developing. The skull remains soft. Ninth month  The fetus gains about  lb (0.23 kg) each week.  The lungs are fully developed.  Patterns of sleep develop.  The fetus's head typically moves into a head-down position (vertex) in the uterus to prepare for birth.  The fetus weighs 6-9 lb (2.72-4.08 kg) and is 19-20 inches (48.26-50.8 cm) long. What can I do to have a healthy pregnancy and help my baby develop? General instructions  Take prenatal vitamins as directed by your health care provider. These include vitamins such as folic acid, iron, calcium, and vitamin D. They are important for healthy development.  Take medicines only as directed by your health care provider. Read labels and ask a pharmacist or your health care provider whether over-the-counter medicines, supplements, and prescription drugs  are safe to take during pregnancy.  Keep all follow-up visits as directed by your health care provider. This is important. Follow-up visits include prenatal care and  screening tests. How do I know if my baby is developing well? At each prenatal visit, your health care provider will do several different tests to check on your health and keep track of your baby's development. These include:  Fundal height and position. ? Your health care provider will measure your growing belly from your pubic bone to the top of the uterus using a tape measure. ? Your health care provider will also feel your belly to determine your baby's position.  Heartbeat. ? An ultrasound in the first trimester can confirm pregnancy and show a heartbeat, depending on how far along you are. ? Your health care provider will check your baby's heart rate at every prenatal visit.  Second trimester ultrasound. ? This ultrasound checks your baby's development. It also may show your baby's gender. What should I do if I have concerns about my baby's development? Always talk with your health care provider about any concerns that you may have about your pregnancy and your baby. Summary  A pregnancy usually lasts 280 days, or about 40 weeks. Pregnancy is divided into three periods of growth, also called trimesters.  Your health care provider will monitor your baby's growth and development throughout your pregnancy.  Follow your health care provider's recommendations about taking prenatal vitamins and medicines during your pregnancy.  Talk with your health care provider if you have any concerns about your pregnancy or your developing baby. This information is not intended to replace advice given to you by your health care provider. Make sure you discuss any questions you have with your health care provider. Document Revised: 12/30/2018 Document Reviewed: 07/22/2017 Elsevier Patient Education  2020 [place] of Pregnancy  The first trimester of pregnancy is from week 1 until the end of week 13 (months 1 through 3). During this time, your baby will begin to develop inside  you. At 6-8 weeks, the eyes and face are formed, and the heartbeat can be seen on ultrasound. At the end of 12 weeks, all the baby's organs are formed. Prenatal care is all the medical care you receive before the birth of your baby. Make sure you get good prenatal care and follow all of your doctor's instructions. Follow these instructions at home: Medicines  Take over-the-counter and prescription medicines only as told by your doctor. Some medicines are safe and some medicines are not safe during pregnancy.  Take a prenatal vitamin that contains at least 600 micrograms (mcg) of folic acid.  If you have trouble pooping (constipation), take medicine that will make your stool soft (stool softener) if your doctor approves. Eating and drinking   Eat regular, healthy meals.  Your doctor will tell you the amount of weight gain that is right for you.  Avoid raw meat and uncooked cheese.  If you feel sick to your stomach (nauseous) or throw up (vomit): ? Eat 4 or 5 small meals a day instead of 3 large meals. ? Try eating a few soda crackers. ? Drink liquids between meals instead of during meals.  To prevent constipation: ? Eat foods that are high in fiber, like fresh fruits and vegetables, whole grains, and beans. ? Drink enough fluids to keep your pee (urine) clear or pale yellow. Activity  Exercise only as told by your doctor. Stop exercising if you have cramps or pain  in your lower belly (abdomen) or low back.  Do not exercise if it is too hot, too humid, or if you are in a place of great height (high altitude).  Try to avoid standing for long periods of time. Move your legs often if you must stand in one place for a long time.  Avoid heavy lifting.  Wear low-heeled shoes. Sit and stand up straight.  You can have sex unless your doctor tells you not to. Relieving pain and discomfort  Wear a good support bra if your breasts are sore.  Take warm water baths (sitz baths) to soothe  pain or discomfort caused by hemorrhoids. Use hemorrhoid cream if your doctor says it is okay.  Rest with your legs raised if you have leg cramps or low back pain.  If you have puffy, bulging veins (varicose veins) in your legs: ? Wear support hose or compression stockings as told by your doctor. ? Raise (elevate) your feet for 15 minutes, 3-4 times a day. ? Limit salt in your food. Prenatal care  Schedule your prenatal visits by the twelfth week of pregnancy.  Write down your questions. Take them to your prenatal visits.  Keep all your prenatal visits as told by your doctor. This is important. Safety  Wear your seat belt at all times when driving.  Make a list of emergency phone numbers. The list should include numbers for family, friends, the hospital, and police and fire departments. General instructions  Ask your doctor for a referral to a local prenatal class. Begin classes no later than at the start of month 6 of your pregnancy.  Ask for help if you need counseling or if you need help with nutrition. Your doctor can give you advice or tell you where to go for help.  Do not use hot tubs, steam rooms, or saunas.  Do not douche or use tampons or scented sanitary pads.  Do not cross your legs for long periods of time.  Avoid all herbs and alcohol. Avoid drugs that are not approved by your doctor.  Do not use any tobacco products, including cigarettes, chewing tobacco, and electronic cigarettes. If you need help quitting, ask your doctor. You may get counseling or other support to help you quit.  Avoid cat litter boxes and soil used by cats. These carry germs that can cause birth defects in the baby and can cause a loss of your baby (miscarriage) or stillbirth.  Visit your dentist. At home, brush your teeth with a soft toothbrush. Be gentle when you floss. Contact a doctor if:  You are dizzy.  You have mild cramps or pressure in your lower belly.  You have a nagging pain  in your belly area.  You continue to feel sick to your stomach, you throw up, or you have watery poop (diarrhea).  You have a bad smelling fluid coming from your vagina.  You have pain when you pee (urinate).  You have increased puffiness (swelling) in your face, hands, legs, or ankles. Get help right away if:  You have a fever.  You are leaking fluid from your vagina.  You have spotting or bleeding from your vagina.  You have very bad belly cramping or pain.  You gain or lose weight rapidly.  You throw up blood. It may look like coffee grounds.  You are around people who have [place] measles, fifth disease, or chickenpox.  You have a very bad headache.  You have shortness of breath.  You  have any kind of trauma, such as from a fall or a car accident. Summary  The first trimester of pregnancy is from week 1 until the end of week 13 (months 1 through 3).  To take care of yourself and your unborn baby, you will need to eat healthy meals, take medicines only if your doctor tells you to do so, and do activities that are safe for you and your baby.  Keep all follow-up visits as told by your doctor. This is important as your doctor will have to ensure that your baby is healthy and growing well. This information is not intended to replace advice given to you by your health care provider. Make sure you discuss any questions you have with your health care provider. Document Revised: 12/30/2018 Document Reviewed: 09/16/2016 Elsevier Patient Education  2020 [employer]. Commonly Asked Questions During Pregnancy  Cats: A parasite can be excreted in cat feces.  To avoid exposure you need to have another person empty the little box.  If you must empty the litter box you will need to wear gloves.  Wash your hands after handling your cat.  This parasite can also be found in raw or undercooked meat so this should also be avoided.  Colds, Sore Throats, Flu: Please check your medication  sheet to see what you can take for symptoms.  If your symptoms are unrelieved by these medications please call the office.  Dental Work: Most any dental work Investment banker, corporate recommends is permitted.  X-rays should only be taken during the first trimester if absolutely necessary.  Your abdomen should be shielded with a lead apron during all x-rays.  Please notify your provider prior to receiving any x-rays.  Novocaine is fine; gas is not recommended.  If your dentist requires a note from [place] prior to dental work please call the office and we will provide one for you.  Exercise: Exercise is an important part of staying healthy during your pregnancy.  You may continue most exercises you were accustomed to prior to pregnancy.  Later in your pregnancy you will most likely notice you have difficulty with activities requiring balance like riding a bicycle.  It is important that you listen to your body and avoid activities that put you at a higher risk of falling.  Adequate rest and staying well hydrated are a must!  If you have questions about the safety of specific activities ask your provider.    Exposure to Children with illness: Try to avoid obvious exposure; report any symptoms to [place] when noted,  If you have chicken pos, red measles or mumps, you should be immune to these diseases.   Please do not take any vaccines while pregnant unless you have checked with your OB provider.  Fetal Movement: After 28 weeks we recommend you do "kick counts" twice daily.  Lie or sit down in a calm quiet environment and count your baby movements "kicks".  You should feel your baby at least 10 times per hour.  If you have not felt 10 kicks within the first hour get up, walk around and have something sweet to eat or drink then repeat for an additional hour.  If count remains less than 10 per hour notify your provider.  Fumigating: Follow your pest control agent's advice as to how long to stay out of your home.  Ventilate the area  well before re-entering.  Hemorrhoids:   Most over-the-counter preparations can be used during pregnancy.  Check your medication to  see what is safe to use.  It is important to use a stool softener or fiber in your diet and to drink lots of liquids.  If hemorrhoids seem to be getting worse please call the office.   Hot Tubs:  Hot tubs Jacuzzis and saunas are not recommended while pregnant.  These increase your internal body temperature and should be avoided.  Intercourse:  Sexual intercourse is safe during pregnancy as long as you are comfortable, unless otherwise advised by your provider.  Spotting may occur after intercourse; report any bright red bleeding that is heavier than spotting.  Labor:  If you know that you are in labor, please go to the hospital.  If you are unsure, please call the office and let us help you decide what to do.  Lifting, straining, etc:  If your job requires heavy lifting or straining please check with your provider for any limitations.  Generally, you should not lift items heavier than that you can lift simply with your hands and arms (no back muscles)  Painting:  Paint fumes do not harm your pregnancy, but may make you ill and should be avoided if possible.  Latex or water based paints have less odor than oils.  Use adequate ventilation while painting.  Permanents & Hair Color:  Chemicals in hair dyes are not recommended as they cause increase hair dryness which can increase hair loss during pregnancy.  " Highlighting" and permanents are allowed.  Dye may be absorbed differently and permanents may not hold as well during pregnancy.  Sunbathing:  Use a sunscreen, as skin burns easily during pregnancy.  Drink plenty of fluids; avoid over heating.  Tanning Beds:  Because their possible side effects are still unknown, tanning beds are not recommended.  Ultrasound Scans:  Routine ultrasounds are performed at approximately 20 weeks.  You will be able to see your baby's  general anatomy an if you would like to know the gender this can usually be determined as well.  If it is questionable when you conceived you may also receive an ultrasound early in your pregnancy for dating purposes.  Otherwise ultrasound exams are not routinely performed unless there is a medical necessity.  Although you can request a scan we ask that you pay for it when conducted because insurance does not cover " patient request" scans.  Work: If your pregnancy proceeds without complications you may work until your due date, unless your physician or employer advises otherwise.  Round Ligament Pain/Pelvic Discomfort:  Sharp, shooting pains not associated with bleeding are fairly common, usually occurring in the second trimester of pregnancy.  They tend to be worse when standing up or when you remain standing for long periods of time.  These are the result of pressure of certain pelvic ligaments called "round ligaments".  Rest, Tylenol and heat seem to be the most effective relief.  As the womb and fetus grow, they rise out of the pelvis and the discomfort improves.  Please notify the office if your pain seems different than that described.  It may represent a more serious condition.  Common Medications Safe in Pregnancy  Acne:      Constipation:  Benzoyl Peroxide     Colace  Clindamycin      Dulcolax Suppository  Topica Erythromycin     Fibercon  Salicylic Acid      Metamucil         Miralax AVOID:        Senakot   Accutane  Cough:  Retin-A       Cough Drops  Tetracycline      Phenergan w/ Codeine if Rx  Minocycline      Robitussin (Plain & DM)  Antibiotics:     Crabs/Lice:  Ceclor       RID  Cephalosporins    AVOID:  E-Mycins      Kwell  Keflex  Macrobid/Macrodantin   Diarrhea:  Penicillin      Kao-Pectate  Zithromax      Imodium AD         PUSH FLUIDS AVOID:       Cipro     Fever:  Tetracycline      Tylenol (Regular or Extra  Minocycline       Strength)  Levaquin      Extra  Strength-Do not          Exceed 8 tabs/24 hrs Caffeine:        '200mg'$ /day (equiv. To 1 cup of coffee or  approx. 3 12 oz sodas)         Gas: Cold/Hayfever:       Gas-X  Benadryl      Mylicon  Claritin       Phazyme  **Claritin-D        Chlor-Trimeton    Headaches:  Dimetapp      ASA-Free Excedrin  Drixoral-Non-Drowsy     Cold Compress  Mucinex (Guaifenasin)     Tylenol (Regular or Extra  Sudafed/Sudafed-12 Hour     Strength)  **Sudafed PE Pseudoephedrine   Tylenol Cold & Sinus     Vicks Vapor Rub  Zyrtec  **AVOID if Problems With Blood Pressure         Heartburn: Avoid lying down for at least 1 hour after meals  Aciphex      Maalox     Rash:  Milk of Magnesia     Benadryl    Mylanta       1% Hydrocortisone Cream  Pepcid  Pepcid Complete   Sleep Aids:  Prevacid      Ambien   Prilosec       Benadryl  Rolaids       Chamomile Tea  Tums (Limit 4/day)     Unisom  Zantac       Tylenol PM         Warm milk-add vanilla or  Hemorrhoids:       Sugar for taste  Anusol/Anusol H.C.  (RX: Analapram 2.5%)  Sugar Substitutes:  Hydrocortisone OTC     Ok in moderation  Preparation H      Tucks        Vaseline lotion applied to tissue with wiping    Herpes:     Throat:  Acyclovir      Oragel  Famvir  Valtrex     Vaccines:         Flu Shot Leg Cramps:       *Gardasil  Benadryl      Hepatitis A         Hepatitis B Nasal Spray:       Pneumovax  Saline Nasal Spray     Polio Booster         Tetanus Nausea:       Tuberculosis test or PPD  Vitamin B6 25 mg TID   AVOID:    Dramamine      *Gardasil  Emetrol       Live Poliovirus  Ginger Root 250 mg  QID    MMR (measles, mumps &  High Complex Carbs @ Bedtime    rebella)  Sea Bands-Accupressure    Varicella (Chickenpox)  Unisom 1/2 tab TID     *No known complications           If received before Pain:         Known pregnancy;   Darvocet       Resume series  after  Lortab        Delivery  Percocet    Yeast:   Tramadol      Femstat  Tylenol 3      Gyne-lotrimin  Ultram       Monistat  Vicodin           MISC:         All Sunscreens           Hair Coloring/highlights          Insect Repellant's          (Including DEET)         Mystic Tans

## 2019-12-30 NOTE — Progress Notes (Signed)
      Abbe Amsterdam presents for NOB nurse intake visit. Pregnancy confirmation done at [facility], 12/19/2019, with Doreene Burke.  G1.  P0.  LMP 10/10/2019.  EDD 07/08/2020.  Ga [redacted]w[redacted]d. Pregnancy education material explained and given.  0 cats in the home.  NOB labs ordered. HIV and drug screen explained and ordered/declined. Genetic screening discussed. Genetic testing; Declined. Pt to discuss genetic testing with provider. PNV encouraged. Pt to follow up with provider in 1 weeks for NOB physical. FMLA and [facility] Finanical Policy forms reviewed, completed and signed by pt.   BP 123/75   Pulse (!) 118   Ht 5\' 7"  (1.702 m)   Wt 152 lb 12.8 oz (69.3 kg)   LMP 10/10/2019 (Exact Date)   BMI 23.93 kg/m

## 2019-12-31 LAB — URINALYSIS, ROUTINE W REFLEX MICROSCOPIC
Bilirubin, UA: NEGATIVE
Glucose, UA: NEGATIVE
Ketones, UA: NEGATIVE
Leukocytes,UA: NEGATIVE
Nitrite, UA: NEGATIVE
Protein,UA: NEGATIVE
RBC, UA: NEGATIVE
Specific Gravity, UA: 1.011 (ref 1.005–1.030)
Urobilinogen, Ur: 0.2 mg/dL (ref 0.2–1.0)
pH, UA: 5.5 (ref 5.0–7.5)

## 2019-12-31 LAB — TOXOPLASMA ANTIBODIES- IGG AND  IGM
Toxoplasma Antibody- IgM: <3 AU/mL (ref 0.0–7.9)
Toxoplasma IgG Ratio: <3 IU/mL (ref 0.0–7.1)

## 2019-12-31 LAB — ABO AND RH: Rh Factor: POSITIVE

## 2019-12-31 LAB — RUBELLA SCREEN: Rubella Antibodies, IGG: 1.03 index (ref >0.99)

## 2019-12-31 LAB — RPR: RPR Ser Ql: NONREACTIVE

## 2019-12-31 LAB — ANTIBODY SCREEN: Antibody Screen: NEGATIVE

## 2019-12-31 LAB — HIV ANTIBODY (ROUTINE TESTING W REFLEX): HIV Screen 4th Generation wRfx: NONREACTIVE

## 2019-12-31 LAB — HEPATITIS B SURFACE ANTIGEN: Hepatitis B Surface Ag: NEGATIVE

## 2019-12-31 LAB — VARICELLA ZOSTER ANTIBODY, IGG: Varicella zoster IgG: 413 index (ref >165)

## 2020-01-01 LAB — DRUG PROFILE, UR, 9 DRUGS (LABCORP)
Amphetamines, Urine: NEGATIVE ng/mL
Barbiturate Quant, Ur: NEGATIVE ng/mL
Benzodiazepine Quant, Ur: NEGATIVE ng/mL
Cannabinoid Quant, Ur: NEGATIVE ng/mL
Cocaine (Metab.): NEGATIVE ng/mL
Methadone Screen, Urine: NEGATIVE ng/mL
Opiate Quant, Ur: NEGATIVE ng/mL
PCP Quant, Ur: NEGATIVE ng/mL
Propoxyphene: NEGATIVE ng/mL

## 2020-01-01 LAB — GC/CHLAMYDIA PROBE AMP
Chlamydia trachomatis, NAA: NEGATIVE
Neisseria Gonorrhoeae by PCR: NEGATIVE

## 2020-01-01 LAB — NICOTINE SCREEN, URINE: Cotinine Ql Scrn, Ur: NEGATIVE ng/mL

## 2020-01-01 LAB — CULTURE, OB URINE

## 2020-01-01 LAB — URINE CULTURE, OB REFLEX: Organism ID, Bacteria: NO GROWTH

## 2020-01-03 ENCOUNTER — Other Ambulatory Visit (HOSPITAL_COMMUNITY)
Admission: RE | Admit: 2020-01-03 | Discharge: 2020-01-03 | Disposition: A | Discharge status: Home / Self Care | Payer: Self-pay | Source: Ambulatory Visit | Attending: Certified Nurse Midwife | Admitting: Certified Nurse Midwife

## 2020-01-03 ENCOUNTER — Other Ambulatory Visit: Payer: Self-pay

## 2020-01-03 ENCOUNTER — Ambulatory Visit (INDEPENDENT_AMBULATORY_CARE_PROVIDER_SITE_OTHER): Payer: Self-pay | Admitting: Certified Nurse Midwife

## 2020-01-03 ENCOUNTER — Encounter: Payer: Self-pay | Admitting: Certified Nurse Midwife

## 2020-01-03 VITALS — BP 111/66 | HR 93 | Wt 150.4 lb

## 2020-01-03 DIAGNOSIS — Z124 Encounter for screening for malignant neoplasm of cervix: Secondary | ICD-10-CM | POA: Insufficient documentation

## 2020-01-03 DIAGNOSIS — Z3401 Encounter for supervision of normal first pregnancy, first trimester: Secondary | ICD-10-CM

## 2020-01-03 LAB — POCT URINALYSIS DIPSTICK OB
Bilirubin, UA: NEGATIVE
Blood, UA: NEGATIVE
Glucose, UA: NEGATIVE
Ketones, UA: NEGATIVE
Leukocytes, UA: NEGATIVE
Nitrite, UA: NEGATIVE
POC,PROTEIN,UA: NEGATIVE
Spec Grav, UA: 1.02 (ref 1.010–1.025)
Urobilinogen, UA: 0.2 E.U./dL
pH, UA: 5 (ref 5.0–8.0)

## 2020-01-03 NOTE — Addendum Note (Signed)
Addended by: Brooke Dare on: 01/03/2020 04:42 PM   Modules accepted: Orders

## 2020-01-03 NOTE — Progress Notes (Signed)
NEW OB HISTORY AND PHYSICAL  SUBJECTIVE:       Sally Hall is a 29 y.o. G1P0 female, Patient's last menstrual period was 10/10/2019 (exact date)., Estimated Date of Delivery: 07/08/20, [redacted]w[redacted]d, presents today for establishment of Prenatal Care. She has no unusual complaints.    Social Married , lives with spouse and dog Works; part time [employer] Exercise: none Smoke/drink/drugs: denies  Gynecologic History Patient's last menstrual period was 10/10/2019 (exact date). Normal Contraception: none Last Pap: @ age 64  Results were: normal per pt  Obstetric History OB History  Gravida Para Term Preterm AB Living  1            SAB TAB Ectopic Multiple Live Births               # Outcome Date GA Lbr Len/2nd Weight Sex Delivery Anes PTL Lv  1 Current             Past Medical History:  Diagnosis Date  . Pulmonary stenosis     No past surgical history on file.  Current Outpatient Medications on File Prior to Visit  Medication Sig Dispense Refill  . Prenatal Vit-Fe Fumarate-FA (PRENATAL MULTIVITAMIN) TABS tablet Take 1 tablet by mouth daily at 12 noon.     No current facility-administered medications on file prior to visit.    Allergies  Allergen Reactions  . Penicillins     Social History   Socioeconomic History  . Marital status: Married    Spouse name: Not on file  . Number of children: Not on file  . Years of education: Not on file  . Highest education level: Not on file  Occupational History  . Not on file  Tobacco Use  . Smoking status: Never Smoker  . Smokeless tobacco: Never Used  Substance and Sexual Activity  . Alcohol use: No  . Drug use: No  . Sexual activity: Yes    Birth control/protection: None  Other Topics Concern  . Not on file  Social History Narrative  . Not on file   Social Determinants of Health   Financial Resource Strain:   . Difficulty of Paying Living Expenses:   Food Insecurity:   . Worried About Programme researcher, broadcasting/film/video  in the Last Year:   . Barista in the Last Year:   Transportation Needs:   . Freight forwarder (Medical):   Marland Kitchen Lack of Transportation (Non-Medical):   Physical Activity:   . Days of Exercise per Week:   . Minutes of Exercise per Session:   Stress:   . Feeling of Stress :   Social Connections:   . Frequency of Communication with Friends and Family:   . Frequency of Social Gatherings with Friends and Family:   . Attends Religious Services:   . Active Member of Clubs or Organizations:   . Attends Banker Meetings:   Marland Kitchen Marital Status:   Intimate Partner Violence:   . Fear of Current or Ex-Partner:   . Emotionally Abused:   Marland Kitchen Physically Abused:   . Sexually Abused:     Family History  Problem Relation Age of Onset  . Diabetes Maternal Grandfather     The following portions of the patient's history were reviewed and updated as appropriate: allergies, current medications, past OB history, past medical history, past surgical history, past family history, past social history, and problem list.    OBJECTIVE: Initial Physical Exam (New OB)  GENERAL APPEARANCE: alert, well  appearing, in no apparent distress, oriented to person, place and time HEAD: normocephalic, atraumatic MOUTH: mucous membranes moist, pharynx normal without lesions THYROID: no thyromegaly or masses present BREASTS: no masses noted, no significant tenderness, no palpable axillary nodes, no skin changes LUNGS: clear to auscultation, no wheezes, rales or rhonchi, symmetric air entry HEART: regular rate and rhythm, no murmurs ABDOMEN: soft, nontender, nondistended, no abnormal masses, no epigastric pain, fundus soft, nontender 13 weeks size and FHT present EXTREMITIES: no redness or tenderness in the calves or thighs, no edema, no limitation in range of motion, intact peripheral pulses SKIN: normal coloration and turgor, no rashes LYMPH NODES: no adenopathy palpable NEUROLOGIC: alert,  oriented, normal speech, no focal findings or movement disorder noted  PELVIC EXAM EXTERNAL GENITALIA: normal appearing vulva with no masses, tenderness or lesions VAGINA: no abnormal discharge or lesions CERVIX: no lesions or cervical motion tenderness UTERUS: gravid ADNEXA: no masses palpable and nontender OB EXAM PELVIMETRY: appears adequate RECTUM: exam not indicated  ASSESSMENT: Normal pregnancy  PLAN: Prenatal care See ordersNew OB counseling: The patient has been given an overview regarding routine prenatal care. Recommendations regarding diet, weight gain, and exercise in pregnancy were given. Prenatal testing, optional genetic testing,carrier screening testing, and ultrasound use in pregnancy were reviewed. Declines genetic testing,  Benefits of Breast Feeding were discussed. The patient is encouraged to consider nursing her baby post partum.  Philip Aspen, CNM

## 2020-01-03 NOTE — Patient Instructions (Signed)

## 2020-01-04 LAB — CBC
Hematocrit: 37.9 % (ref 34.0–46.6)
Hemoglobin: 12.9 g/dL (ref 11.1–15.9)
MCH: 30.7 pg (ref 26.6–33.0)
MCHC: 34 g/dL (ref 31.5–35.7)
MCV: 90 fL (ref 79–97)
Platelets: 232 10*3/uL (ref 150–450)
RBC: 4.2 x10E6/uL (ref 3.77–5.28)
RDW: 13.5 % (ref 11.7–15.4)
WBC: 8.3 10*3/uL (ref 3.4–10.8)

## 2020-01-05 LAB — CYTOLOGY - PAP
Comment: NEGATIVE
Diagnosis: NEGATIVE
High risk HPV: NEGATIVE

## 2020-01-31 ENCOUNTER — Telehealth: Payer: Self-pay

## 2020-01-31 ENCOUNTER — Telehealth: Payer: Self-pay | Admitting: Certified Nurse Midwife

## 2020-01-31 NOTE — Telephone Encounter (Signed)
Letter done and faxed. Patient informed through [employer].

## 2020-01-31 NOTE — Telephone Encounter (Signed)
  Patient called in saying she has an appointment with a ultrasound place and they need a note from her provider stating she is receiving prenatal care with [place] in order for them to do her ultrasound. She has an appointment with them today at 11:30 am. She would like this note faxed over to them at [phone]. I informed patient that clinical staff does have 24-48 hours to complete tasks and patient verbalized understanding.

## 2020-02-03 ENCOUNTER — Encounter: Payer: Self-pay | Admitting: Certified Nurse Midwife

## 2020-02-03 ENCOUNTER — Ambulatory Visit: Payer: Self-pay | Admitting: Certified Nurse Midwife

## 2020-02-03 ENCOUNTER — Other Ambulatory Visit: Payer: Self-pay

## 2020-02-03 VITALS — BP 104/66 | HR 87 | Wt 152.5 lb

## 2020-02-03 DIAGNOSIS — Z3402 Encounter for supervision of normal first pregnancy, second trimester: Secondary | ICD-10-CM

## 2020-02-03 DIAGNOSIS — Z3689 Encounter for other specified antenatal screening: Secondary | ICD-10-CM

## 2020-02-03 DIAGNOSIS — Z3A17 17 weeks gestation of pregnancy: Secondary | ICD-10-CM

## 2020-02-03 LAB — POCT URINALYSIS DIPSTICK OB
Bilirubin, UA: NEGATIVE
Glucose, UA: NEGATIVE
Ketones, UA: NEGATIVE
Leukocytes, UA: NEGATIVE
Nitrite, UA: NEGATIVE
POC,PROTEIN,UA: NEGATIVE
Spec Grav, UA: 1.01 (ref 1.010–1.025)
Urobilinogen, UA: 0.2 E.U./dL
pH, UA: 8 (ref 5.0–8.0)

## 2020-02-03 NOTE — Progress Notes (Signed)
ROB-Pt present for routine prenatal care. Pt stated that she was doing well no problems.  

## 2020-02-03 NOTE — Progress Notes (Signed)
I have seen, interviewed, and examined the patient in conjunction with the [employer] Nurse Practitioner student and affirm the diagnosis and management plan.   Gunnar Bulla, CNM [facility], [facility] 02/03/20 9:41 AM

## 2020-02-03 NOTE — Patient Instructions (Signed)
Second Trimester of Pregnancy  The second trimester is from week 14 through week 27 (month 4 through 6). This is often the time in pregnancy that you feel your best. Often times, morning sickness has lessened or quit. You may have more energy, and you may get hungry more often. Your unborn baby is growing rapidly. At the end of the sixth month, he or she is about 9 inches long and weighs about 1 pounds. You will likely feel the baby move between 18 and 20 weeks of pregnancy. Follow these instructions at home: Medicines  Take over-the-counter and prescription medicines only as told by your doctor. Some medicines are safe and some medicines are not safe during pregnancy.  Take a prenatal vitamin that contains at least 600 micrograms (mcg) of folic acid.  If you have trouble pooping (constipation), take medicine that will make your stool soft (stool softener) if your doctor approves. Eating and drinking   Eat regular, healthy meals.  Avoid raw meat and uncooked cheese.  If you get low calcium from the food you eat, talk to your doctor about taking a daily calcium supplement.  Avoid foods that are high in fat and sugars, such as fried and sweet foods.  If you feel sick to your stomach (nauseous) or throw up (vomit): ? Eat 4 or 5 small meals a day instead of 3 large meals. ? Try eating a few soda crackers. ? Drink liquids between meals instead of during meals.  To prevent constipation: ? Eat foods that are high in fiber, like fresh fruits and vegetables, whole grains, and beans. ? Drink enough fluids to keep your pee (urine) clear or pale yellow. Activity  Exercise only as told by your doctor. Stop exercising if you start to have cramps.  Do not exercise if it is too hot, too humid, or if you are in a place of great height (high altitude).  Avoid heavy lifting.  Wear low-heeled shoes. Sit and stand up straight.  You can continue to have sex unless your doctor tells you not  to. Relieving pain and discomfort  Wear a good support bra if your breasts are tender.  Take warm water baths (sitz baths) to soothe pain or discomfort caused by hemorrhoids. Use hemorrhoid cream if your doctor approves.  Rest with your legs raised if you have leg cramps or low back pain.  If you develop puffy, bulging veins (varicose veins) in your legs: ? Wear support hose or compression stockings as told by your doctor. ? Raise (elevate) your feet for 15 minutes, 3-4 times a day. ? Limit salt in your food. Prenatal care  Write down your questions. Take them to your prenatal visits.  Keep all your prenatal visits as told by your doctor. This is important. Safety  Wear your seat belt when driving.  Make a list of emergency phone numbers, including numbers for family, friends, the hospital, and police and fire departments. General instructions  Ask your doctor about the right foods to eat or for help finding a counselor, if you need these services.  Ask your doctor about local prenatal classes. Begin classes before month 6 of your pregnancy.  Do not use hot tubs, steam rooms, or saunas.  Do not douche or use tampons or scented sanitary pads.  Do not cross your legs for long periods of time.  Visit your dentist if you have not done so. Use a soft toothbrush to brush your teeth. Floss gently.  Avoid all smoking, herbs,   and alcohol. Avoid drugs that are not approved by your doctor.  Do not use any products that contain nicotine or tobacco, such as cigarettes and e-cigarettes. If you need help quitting, ask your doctor.  Avoid cat litter boxes and soil used by cats. These carry germs that can cause birth defects in the baby and can cause a loss of your baby (miscarriage) or stillbirth. Contact a doctor if:  You have mild cramps or pressure in your lower belly.  You have pain when you pee (urinate).  You have bad smelling fluid coming from your vagina.  You continue to  feel sick to your stomach (nauseous), throw up (vomit), or have watery poop (diarrhea).  You have a nagging pain in your belly area.  You feel dizzy. Get help right away if:  You have a fever.  You are leaking fluid from your vagina.  You have spotting or bleeding from your vagina.  You have severe belly cramping or pain.  You lose or gain weight rapidly.  You have trouble catching your breath and have chest pain.  You notice sudden or extreme puffiness (swelling) of your face, hands, ankles, feet, or legs.  You have not felt the baby move in over an hour.  You have severe headaches that do not go away when you take medicine.  You have trouble seeing. Summary  The second trimester is from week 14 through week 27 (months 4 through 6). This is often the time in pregnancy that you feel your best.  To take care of yourself and your unborn baby, you will need to eat healthy meals, take medicines only if your doctor tells you to do so, and do activities that are safe for you and your baby.  Call your doctor if you get sick or if you notice anything unusual about your pregnancy. Also, call your doctor if you need help with the right food to eat, or if you want to know what activities are safe for you. This information is not intended to replace advice given to you by your health care provider. Make sure you discuss any questions you have with your health care provider. Document Revised: 12/31/2018 Document Reviewed: 10/14/2016 Elsevier Patient Education  2020 [employer]. How a Baby Grows During Pregnancy  Pregnancy begins when a female's sperm enters a female's egg (fertilization). Fertilization usually happens in one of the tubes (fallopian tubes) that connect the ovaries to the womb (uterus). The fertilized egg moves down the fallopian tube to the uterus. Once it reaches the uterus, it implants into the lining of the uterus and begins to grow. For the first 10 weeks, the fertilized  egg is called an embryo. After 10 weeks, it is called a fetus. As the fetus continues to grow, it receives oxygen and nutrients through tissue (placenta) that grows to support the developing baby. The placenta is the life support system for the baby. It provides oxygen and nutrition and removes waste. Learning as much as you can about your pregnancy and how your baby is developing can help you enjoy the experience. It can also make you aware of when there might be a problem and when to ask questions. How long does a typical pregnancy last? A pregnancy usually lasts 280 days, or about 40 weeks. Pregnancy is divided into three periods of growth, also called trimesters:  First trimester: 0-12 weeks.  Second trimester: 13-27 weeks.  Third trimester: 28-40 weeks. The day when your baby is ready to be  born (full term) is your estimated date of delivery. How does my baby develop month by month? First month  The fertilized egg attaches to the inside of the uterus.  Some cells will form the placenta. Others will form the fetus.  The arms, legs, brain, spinal cord, lungs, and heart begin to develop.  At the end of the first month, the heart begins to beat. Second month  The bones, inner ear, eyelids, hands, and feet form.  The genitals develop.  By the end of 8 weeks, all major organs are developing. Third month  All of the internal organs are forming.  Teeth develop below the gums.  Bones and muscles begin to grow. The spine can flex.  The skin is transparent.  Fingernails and toenails begin to form.  Arms and legs continue to grow longer, and hands and feet develop.  The fetus is about 3 inches (7.6 cm) long. Fourth month  The placenta is completely formed.  The external sex organs, neck, outer ear, eyebrows, eyelids, and fingernails are formed.  The fetus can hear, swallow, and move its arms and legs.  The kidneys begin to produce urine.  The skin is covered with a  white, waxy coating (vernix) and very fine hair (lanugo). Fifth month  The fetus moves around more and can be felt for the first time (quickening).  The fetus starts to sleep and wake up and may begin to suck its finger.  The nails grow to the end of the fingers.  The organ in the digestive system that makes bile (gallbladder) functions and helps to digest nutrients.  If your baby is a girl, eggs are present in her ovaries. If your baby is a boy, testicles start to move down into his scrotum. Sixth month  The lungs are formed.  The eyes open. The brain continues to develop.  Your baby has fingerprints and toe prints. Your baby's hair grows thicker.  At the end of the second trimester, the fetus is about 9 inches (22.9 cm) long. Seventh month  The fetus kicks and stretches.  The eyes are developed enough to sense changes in light.  The hands can make a grasping motion.  The fetus responds to sound. Eighth month  All organs and body systems are fully developed and functioning.  Bones harden, and taste buds develop. The fetus may hiccup.  Certain areas of the brain are still developing. The skull remains soft. Ninth month  The fetus gains about  lb (0.23 kg) each week.  The lungs are fully developed.  Patterns of sleep develop.  The fetus's head typically moves into a head-down position (vertex) in the uterus to prepare for birth.  The fetus weighs 6-9 lb (2.72-4.08 kg) and is 19-20 inches (48.26-50.8 cm) long. What can I do to have a healthy pregnancy and help my baby develop? General instructions  Take prenatal vitamins as directed by your health care provider. These include vitamins such as folic acid, iron, calcium, and vitamin D. They are important for healthy development.  Take medicines only as directed by your health care provider. Read labels and ask a pharmacist or your health care provider whether over-the-counter medicines, supplements, and prescription  drugs are safe to take during pregnancy.  Keep all follow-up visits as directed by your health care provider. This is important. Follow-up visits include prenatal care and screening tests. How do I know if my baby is developing well? At each prenatal visit, your health care provider will do  several different tests to check on your health and keep track of your baby's development. These include:  Fundal height and position. ? Your health care provider will measure your growing belly from your pubic bone to the top of the uterus using a tape measure. ? Your health care provider will also feel your belly to determine your baby's position.  Heartbeat. ? An ultrasound in the first trimester can confirm pregnancy and show a heartbeat, depending on how far along you are. ? Your health care provider will check your baby's heart rate at every prenatal visit.  Second trimester ultrasound. ? This ultrasound checks your baby's development. It also may show your baby's gender. What should I do if I have concerns about my baby's development? Always talk with your health care provider about any concerns that you may have about your pregnancy and your baby. Summary  A pregnancy usually lasts 280 days, or about 40 weeks. Pregnancy is divided into three periods of growth, also called trimesters.  Your health care provider will monitor your baby's growth and development throughout your pregnancy.  Follow your health care provider's recommendations about taking prenatal vitamins and medicines during your pregnancy.  Talk with your health care provider if you have any concerns about your pregnancy or your developing baby. This information is not intended to replace advice given to you by your health care provider. Make sure you discuss any questions you have with your health care provider. Document Revised: 12/30/2018 Document Reviewed: 07/22/2017 Elsevier Patient Education  [place]. Common  Medications Safe in Pregnancy  Acne:      Constipation:  Benzoyl Peroxide     Colace  Clindamycin      Dulcolax Suppository  Topica Erythromycin     Fibercon  Salicylic Acid      Metamucil         Miralax AVOID:        Senakot   Accutane    Cough:  Retin-A       Cough Drops  Tetracycline      Phenergan w/ Codeine if Rx  Minocycline      Robitussin (Plain & DM)  Antibiotics:     Crabs/Lice:  Ceclor       RID  Cephalosporins    AVOID:  E-Mycins      Kwell  Keflex  Macrobid/Macrodantin   Diarrhea:  Penicillin      Kao-Pectate  Zithromax      Imodium AD         PUSH FLUIDS AVOID:       Cipro     Fever:  Tetracycline      Tylenol (Regular or Extra  Minocycline       Strength)  Levaquin      Extra Strength-Do not          Exceed 8 tabs/24 hrs Caffeine:        <235m/day (equiv. To 1 cup of coffee or  approx. 3 12 oz sodas)         Gas: Cold/Hayfever:       Gas-X  Benadryl      Mylicon  Claritin       Phazyme  **Claritin-D        Chlor-Trimeton    Headaches:  Dimetapp      ASA-Free Excedrin  Drixoral-Non-Drowsy     Cold Compress  Mucinex (Guaifenasin)     Tylenol (Regular or Extra  Sudafed/Sudafed-12 Hour     Strength)  **Sudafed PE Pseudoephedrine  Tylenol Cold & Sinus     Vicks Vapor Rub  Zyrtec  **AVOID if Problems With Blood Pressure         Heartburn: Avoid lying down for at least 1 hour after meals  Aciphex      Maalox     Rash:  Milk of Magnesia     Benadryl    Mylanta       1% Hydrocortisone Cream  Pepcid  Pepcid Complete   Sleep Aids:  Prevacid      Ambien   Prilosec       Benadryl  Rolaids       Chamomile Tea  Tums (Limit 4/day)     Unisom  Zantac       Tylenol PM         Warm milk-add vanilla or  Hemorrhoids:       Sugar for taste  Anusol/Anusol H.C.  (RX: Analapram 2.5%)  Sugar Substitutes:  Hydrocortisone OTC     Ok in moderation  Preparation H      Tucks        Vaseline lotion applied to tissue with  wiping    Herpes:     Throat:  Acyclovir      Oragel  Famvir  Valtrex     Vaccines:         Flu Shot Leg Cramps:       *Gardasil  Benadryl      Hepatitis A         Hepatitis B Nasal Spray:       Pneumovax  Saline Nasal Spray     Polio Booster         Tetanus Nausea:       Tuberculosis test or PPD  Vitamin B6 25 mg TID   AVOID:    Dramamine      *Gardasil  Emetrol       Live Poliovirus  Ginger Root 250 mg QID    MMR (measles, mumps &  High Complex Carbs @ Bedtime    rebella)  Sea Bands-Accupressure    Varicella (Chickenpox)  Unisom 1/2 tab TID     *No known complications           If received before Pain:         Known pregnancy;   Darvocet       Resume series after  Lortab        Delivery  Percocet    Yeast:   Tramadol      Femstat  Tylenol 3      Gyne-lotrimin  Ultram       Monistat  Vicodin           MISC:         All Sunscreens           Hair Coloring/highlights          Insect Repellant's          (Including DEET)         Mystic Tans Breastfeeding  Choosing to breastfeed is one of the best decisions you can make for yourself and your baby. A change in hormones during pregnancy causes your breasts to make breast milk in your milk-producing glands. Hormones prevent breast milk from being released before your baby is born. They also prompt milk flow after birth. Once breastfeeding has begun, thoughts of your baby, as well as his or her sucking or crying, can stimulate the release of milk from your milk-producing glands.  Benefits of breastfeeding Research shows that breastfeeding offers many health benefits for infants and mothers. It also offers a cost-free and convenient way to feed your baby. For your baby  Your first milk (colostrum) helps your baby's digestive system to function better.  Special cells in your milk (antibodies) help your baby to fight off infections.  Breastfed babies are less likely to develop asthma, allergies, obesity, or type 2 diabetes. They  are also at lower risk for sudden infant death syndrome (SIDS).  Nutrients in breast milk are better able to meet your baby's needs compared to infant formula.  Breast milk improves your baby's brain development. For you  Breastfeeding helps to create a very special bond between you and your baby.  Breastfeeding is convenient. Breast milk costs nothing and is always available at the correct temperature.  Breastfeeding helps to burn calories. It helps you to lose the weight that you gained during pregnancy.  Breastfeeding makes your uterus return faster to its size before pregnancy. It also slows bleeding (lochia) after you give birth.  Breastfeeding helps to lower your risk of developing type 2 diabetes, osteoporosis, rheumatoid arthritis, cardiovascular disease, and breast, ovarian, uterine, and endometrial cancer later in life. Breastfeeding basics Starting breastfeeding  Find a comfortable place to sit or lie down, with your neck and back well-supported.  Place a pillow or a rolled-up blanket under your baby to bring him or her to the level of your breast (if you are seated). Nursing pillows are specially designed to help support your arms and your baby while you breastfeed.  Make sure that your baby's tummy (abdomen) is facing your abdomen.  Gently massage your breast. With your fingertips, massage from the outer edges of your breast inward toward the nipple. This encourages milk flow. If your milk flows slowly, you may need to continue this action during the feeding.  Support your breast with 4 fingers underneath and your thumb above your nipple (make the letter "C" with your hand). Make sure your fingers are well away from your nipple and your baby's mouth.  Stroke your baby's lips gently with your finger or nipple.  When your baby's mouth is open wide enough, quickly bring your baby to your breast, placing your entire nipple and as much of the areola as possible into your baby's  mouth. The areola is the colored area around your nipple. ? More areola should be visible above your baby's upper lip than below the lower lip. ? Your baby's lips should be opened and extended outward (flanged) to ensure an adequate, comfortable latch. ? Your baby's tongue should be between his or her lower gum and your breast.  Make sure that your baby's mouth is correctly positioned around your nipple (latched). Your baby's lips should create a seal on your breast and be turned out (everted).  It is common for your baby to suck about 2-3 minutes in order to start the flow of breast milk. Latching Teaching your baby how to latch onto your breast properly is very important. An improper latch can cause nipple pain, decreased milk supply, and poor weight gain in your baby. Also, if your baby is not latched onto your nipple properly, he or she may swallow some air during feeding. This can make your baby fussy. Burping your baby when you switch breasts during the feeding can help to get rid of the air. However, teaching your baby to latch on properly is still the best way to prevent fussiness from swallowing  air while breastfeeding. Signs that your baby has successfully latched onto your nipple  Silent tugging or silent sucking, without causing you pain. Infant's lips should be extended outward (flanged).  Swallowing heard between every 3-4 sucks once your milk has started to flow (after your let-down milk reflex occurs).  Muscle movement above and in front of his or her ears while sucking. Signs that your baby has not successfully latched onto your nipple  Sucking sounds or smacking sounds from your baby while breastfeeding.  Nipple pain. If you think your baby has not latched on correctly, slip your finger into the corner of your baby's mouth to break the suction and place it between your baby's gums. Attempt to start breastfeeding again. Signs of successful breastfeeding Signs from your  baby  Your baby will gradually decrease the number of sucks or will completely stop sucking.  Your baby will fall asleep.  Your baby's body will relax.  Your baby will retain a small amount of milk in his or her mouth.  Your baby will let go of your breast by himself or herself. Signs from you  Breasts that have increased in firmness, weight, and size 1-3 hours after feeding.  Breasts that are softer immediately after breastfeeding.  Increased milk volume, as well as a change in milk consistency and color by the fifth day of breastfeeding.  Nipples that are not sore, cracked, or bleeding. Signs that your baby is getting enough milk  Wetting at least 1-2 diapers during the first 24 hours after birth.  Wetting at least 5-6 diapers every 24 hours for the first week after birth. The urine should be clear or pale yellow by the age of 5 days.  Wetting 6-8 diapers every 24 hours as your baby continues to grow and develop.  At least 3 stools in a 24-hour period by the age of 5 days. The stool should be soft and yellow.  At least 3 stools in a 24-hour period by the age of 7 days. The stool should be seedy and yellow.  No loss of weight greater than 10% of birth weight during the first 3 days of life.  Average weight gain of 4-7 oz (113-198 g) per week after the age of 4 days.  Consistent daily weight gain by the age of 5 days, without weight loss after the age of 2 weeks. After a feeding, your baby may spit up a small amount of milk. This is normal. Breastfeeding frequency and duration Frequent feeding will help you make more milk and can prevent sore nipples and extremely full breasts (breast engorgement). Breastfeed when you feel the need to reduce the fullness of your breasts or when your baby shows signs of hunger. This is called "breastfeeding on demand." Signs that your baby is hungry include:  Increased alertness, activity, or restlessness.  Movement of the head from side to  side.  Opening of the mouth when the corner of the mouth or cheek is stroked (rooting).  Increased sucking sounds, smacking lips, cooing, sighing, or squeaking.  Hand-to-mouth movements and sucking on fingers or hands.  Fussing or crying. Avoid introducing a pacifier to your baby in the first 4-6 weeks after your baby is born. After this time, you may choose to use a pacifier. Research has shown that pacifier use during the first year of a baby's life decreases the risk of sudden infant death syndrome (SIDS). Allow your baby to feed on each breast as long as he or she wants.  When your baby unlatches or falls asleep while feeding from the first breast, offer the second breast. Because newborns are often sleepy in the first few weeks of life, you may need to awaken your baby to get him or her to feed. Breastfeeding times will vary from baby to baby. However, the following rules can serve as a guide to help you make sure that your baby is properly fed:  Newborns (babies 25 weeks of age or younger) may breastfeed every 1-3 hours.  Newborns should not go without breastfeeding for longer than 3 hours during the day or 5 hours during the night.  You should breastfeed your baby a minimum of 8 times in a 24-hour period. Breast milk pumping     Pumping and storing breast milk allows you to make sure that your baby is exclusively fed your breast milk, even at times when you are unable to breastfeed. This is especially important if you go back to work while you are still breastfeeding, or if you are not able to be present during feedings. Your lactation consultant can help you find a method of pumping that works best for you and give you guidelines about how long it is safe to store breast milk. Caring for your breasts while you breastfeed Nipples can become dry, cracked, and sore while breastfeeding. The following recommendations can help keep your breasts moisturized and healthy:  Avoid using soap on  your nipples.  Wear a supportive bra designed especially for nursing. Avoid wearing underwire-style bras or extremely tight bras (sports bras).  Air-dry your nipples for 3-4 minutes after each feeding.  Use only cotton bra pads to absorb leaked breast milk. Leaking of breast milk between feedings is normal.  Use lanolin on your nipples after breastfeeding. Lanolin helps to maintain your skin's normal moisture barrier. Pure lanolin is not harmful (not toxic) to your baby. You may also hand express a few drops of breast milk and gently massage that milk into your nipples and allow the milk to air-dry. In the first few weeks after giving birth, some women experience breast engorgement. Engorgement can make your breasts feel heavy, warm, and tender to the touch. Engorgement peaks within 3-5 days after you give birth. The following recommendations can help to ease engorgement:  Completely empty your breasts while breastfeeding or pumping. You may want to start by applying warm, moist heat (in the shower or with warm, water-soaked hand towels) just before feeding or pumping. This increases circulation and helps the milk flow. If your baby does not completely empty your breasts while breastfeeding, pump any extra milk after he or she is finished.  Apply ice packs to your breasts immediately after breastfeeding or pumping, unless this is too uncomfortable for you. To do this: ? Put ice in a plastic bag. ? Place a towel between your skin and the bag. ? Leave the ice on for 20 minutes, 2-3 times a day.  Make sure that your baby is latched on and positioned properly while breastfeeding. If engorgement persists after 48 hours of following these recommendations, contact your health care provider or a Science writer. Overall health care recommendations while breastfeeding  Eat 3 healthy meals and 3 snacks every day. Well-nourished mothers who are breastfeeding need an additional 450-500 calories a day.  You can meet this requirement by increasing the amount of a balanced diet that you eat.  Drink enough water to keep your urine pale yellow or clear.  Rest often, relax, and continue to  take your prenatal vitamins to prevent fatigue, stress, and low vitamin and mineral levels in your body (nutrient deficiencies).  Do not use any products that contain nicotine or tobacco, such as cigarettes and e-cigarettes. Your baby may be harmed by chemicals from cigarettes that pass into breast milk and exposure to secondhand smoke. If you need help quitting, ask your health care provider.  Avoid alcohol.  Do not use illegal drugs or marijuana.  Talk with your health care provider before taking any medicines. These include over-the-counter and prescription medicines as well as vitamins and herbal supplements. Some medicines that may be harmful to your baby can pass through breast milk.  It is possible to become pregnant while breastfeeding. If birth control is desired, ask your health care provider about options that will be safe while breastfeeding your baby. Where to find more information: [employer]: www.llli.org Contact a health care provider if:  You feel like you want to stop breastfeeding or have become frustrated with breastfeeding.  Your nipples are cracked or bleeding.  Your breasts are red, tender, or warm.  You have: ? Painful breasts or nipples. ? A swollen area on either breast. ? A fever or chills. ? Nausea or vomiting. ? Drainage other than breast milk from your nipples.  Your breasts do not become full before feedings by the fifth day after you give birth.  You feel sad and depressed.  Your baby is: ? Too sleepy to eat well. ? Having trouble sleeping. ? More than 9 week old and wetting fewer than 6 diapers in a 24-hour period. ? Not gaining weight by 29 days of age.  Your baby has fewer than 3 stools in a 24-hour period.  Your baby's skin or the white  parts of his or her eyes become yellow. Get help right away if:  Your baby is overly tired (lethargic) and does not want to wake up and feed.  Your baby develops an unexplained fever. Summary  Breastfeeding offers many health benefits for infant and mothers.  Try to breastfeed your infant when he or she shows early signs of hunger.  Gently tickle or stroke your baby's lips with your finger or nipple to allow the baby to open his or her mouth. Bring the baby to your breast. Make sure that much of the areola is in your baby's mouth. Offer one side and burp the baby before you offer the other side.  Talk with your health care provider or lactation consultant if you have questions or you face problems as you breastfeed. This information is not intended to replace advice given to you by your health care provider. Make sure you discuss any questions you have with your health care provider. Document Revised: 12/03/2017 Document Reviewed: 10/10/2016 Elsevier Patient Education  [place].

## 2020-02-03 NOTE — Progress Notes (Signed)
ROB- Doing well. Previously had round ligament pain which has improved. Encouraged to drink 80-100 oz of water a day and increase protein intake. Discussed twenty (20) week visit and anatomy scan. Anticipatory guidance given. Reviewed red flags and when to call the office.   RTC x 3-4 weeks for ANATOMY SCAN and ROB or sooner if needed.    Glorious Peach RN [facility] [employer] [facility] [employer] 02/03/20 9:37 AM

## 2020-02-13 ENCOUNTER — Telehealth: Payer: Self-pay | Admitting: Certified Nurse Midwife

## 2020-02-13 NOTE — Telephone Encounter (Signed)
Pt called in and stated that she has some questions about how much her maturity in full would be. The pt stated she wants a copy I told her I have one but I cant email it I can give it at her next appt. The pt is requesting a call back. Please advise

## 2020-02-13 NOTE — Telephone Encounter (Signed)
Pt is calling about her maternity benefits. NOB was 4/13. Did you do a ob payment plan on her? If so, did you contact her about it? Thanks.

## 2020-02-29 ENCOUNTER — Ambulatory Visit (INDEPENDENT_AMBULATORY_CARE_PROVIDER_SITE_OTHER): Payer: Self-pay

## 2020-02-29 ENCOUNTER — Other Ambulatory Visit: Payer: Self-pay

## 2020-02-29 ENCOUNTER — Encounter: Payer: Self-pay | Admitting: Certified Nurse Midwife

## 2020-02-29 ENCOUNTER — Ambulatory Visit (INDEPENDENT_AMBULATORY_CARE_PROVIDER_SITE_OTHER): Payer: Self-pay | Admitting: Certified Nurse Midwife

## 2020-02-29 VITALS — BP 112/68 | HR 97 | Wt 161.6 lb

## 2020-02-29 DIAGNOSIS — Z3A17 17 weeks gestation of pregnancy: Secondary | ICD-10-CM

## 2020-02-29 DIAGNOSIS — Z3402 Encounter for supervision of normal first pregnancy, second trimester: Secondary | ICD-10-CM

## 2020-02-29 DIAGNOSIS — Z3689 Encounter for other specified antenatal screening: Secondary | ICD-10-CM

## 2020-02-29 DIAGNOSIS — Z3A21 21 weeks gestation of pregnancy: Secondary | ICD-10-CM

## 2020-02-29 LAB — POCT URINALYSIS DIPSTICK OB
Bilirubin, UA: NEGATIVE
Blood, UA: NEGATIVE
Glucose, UA: NEGATIVE
Ketones, UA: NEGATIVE
Leukocytes, UA: NEGATIVE
Nitrite, UA: NEGATIVE
POC,PROTEIN,UA: NEGATIVE
Spec Grav, UA: 1.02 (ref 1.010–1.025)
Urobilinogen, UA: 0.2 E.U./dL
pH, UA: 5 (ref 5.0–8.0)

## 2020-02-29 NOTE — Progress Notes (Signed)
ROB, doing well. Feeling fetal movement. U/s today for anatomy ( see below) results reviewed. Reviewed round ligament pain and self help measures.Follow up 3 wks with Marcelino Duster.   Doreene Burke, CNM   Patient Name: Sally Hall DOB: [birth year] MRN: [id number] ULTRASOUND REPORT  Location: [facility] Date of Service: 02/29/2020   Indications:Anatomy Ultrasound Findings:  [name] intrauterine pregnancy is visualized with FHR at 163 BPM. Biometrics give an (U/S) Gestational age of [redacted]w[redacted]d and an (U/S) EDD of 07/15/2020; this correlates with the clinically established Estimated Date of Delivery: 07/08/20  Fetal presentation is Variable.  EFW: 364 g ( 13 oz).   Placenta: posterior. Grade: 1 AFI: subjectively normal.  Anatomic survey is complete and normal; Gender - female.    Right Ovary is normal in appearance. Left Ovary is normal appearance. Survey of the adnexa demonstrates no adnexal masses. There is no free peritoneal fluid in the cul de sac.  Impression: 1. [redacted]w[redacted]d Viable Singleton Intrauterine pregnancy by U/S. 2. (U/S) EDD is consistent with Clinically established Estimated Date of Delivery: 07/08/20 . 3. Normal Anatomy Scan  Recommendations: 1.Clinical correlation with the patient's History and Physical Exam.   Jenine M. Marciano Sequin    RDMS

## 2020-02-29 NOTE — Patient Instructions (Signed)

## 2020-03-07 ENCOUNTER — Telehealth: Payer: Self-pay | Admitting: Certified Nurse Midwife

## 2020-03-14 NOTE — Telephone Encounter (Signed)
Error

## 2020-03-22 ENCOUNTER — Other Ambulatory Visit: Payer: Self-pay

## 2020-03-22 ENCOUNTER — Ambulatory Visit (INDEPENDENT_AMBULATORY_CARE_PROVIDER_SITE_OTHER): Payer: Self-pay | Admitting: Certified Nurse Midwife

## 2020-03-22 VITALS — BP 110/58 | HR 86 | Wt 165.0 lb

## 2020-03-22 DIAGNOSIS — Z3402 Encounter for supervision of normal first pregnancy, second trimester: Secondary | ICD-10-CM

## 2020-03-22 DIAGNOSIS — Z3A24 24 weeks gestation of pregnancy: Secondary | ICD-10-CM

## 2020-03-22 LAB — POCT URINALYSIS DIPSTICK OB
Bilirubin, UA: NEGATIVE
Blood, UA: NEGATIVE
Glucose, UA: NEGATIVE
Ketones, UA: NEGATIVE
Leukocytes, UA: NEGATIVE
Nitrite, UA: NEGATIVE
POC,PROTEIN,UA: NEGATIVE
Spec Grav, UA: 1.01 (ref 1.010–1.025)
Urobilinogen, UA: 0.2 E.U./dL
pH, UA: 5 (ref 5.0–8.0)

## 2020-03-22 NOTE — Patient Instructions (Addendum)
Td (Tetanus, Diphtheria) Vaccine: What You Need to Know 1. Why get vaccinated? Td vaccine can prevent tetanus and diphtheria. Tetanus enters the body through cuts or wounds. Diphtheria spreads from person to person.  TETANUS (T) causes painful stiffening of the muscles. Tetanus can lead to serious health problems, including being unable to open the mouth, having trouble swallowing and breathing, or death.  DIPHTHERIA (D) can lead to difficulty breathing, heart failure, paralysis, or death. 2. Td vaccine Td is only for children 7 years and older, adolescents, and adults.  Td is usually given as a booster dose every 10 years, but it can also be given earlier after a severe and dirty wound or burn. Another vaccine, called Tdap, that protects against pertussis, also known as "whooping cough," in addition to tetanus and diphtheria, may be used instead of Td.  Td may be given at the same time as other vaccines. 3. Talk with your health care provider Tell your vaccine provider if the person getting the vaccine:  Has had an allergic reaction after a previous dose of any vaccine that protects against tetanus or diphtheria, or has any severe, life-threatening allergies.  Has ever had Guillain-Barr Syndrome (also called GBS).  Has had severe pain or swelling after a previous dose of any vaccine that protects against tetanus or diphtheria. In some cases, your health care provider may decide to postpone Td vaccination to a future visit.  People with minor illnesses, such as a cold, may be vaccinated. People who are moderately or severely ill should usually wait until they recover before getting Td vaccine.  Your health care provider can give you more information. 4. Risks of a vaccine reaction  Pain, redness, or swelling where the shot was given, mild fever, headache, feeling tired, and nausea, vomiting, diarrhea, or stomachache sometimes happen after Td vaccine. People sometimes faint after medical  procedures, including vaccination. Tell your provider if you feel dizzy or have vision changes or ringing in the ears.  As with any medicine, there is a very remote chance of a vaccine causing a severe allergic reaction, other serious injury, or death. 5. What if there is a serious problem? An allergic reaction could occur after the vaccinated person leaves the clinic. If you see signs of a severe allergic reaction (hives, swelling of the face and throat, difficulty breathing, a fast heartbeat, dizziness, or weakness), call 9-1-1 and get the person to the nearest hospital.  For other signs that concern you, call your health care provider.  Adverse reactions should be reported to the Vaccine Adverse Event Reporting System (VAERS). Your health care provider will usually file this report, or you can do it yourself. Visit the VAERS website at www.vaers.SamedayNews.es or call 803-880-5411. VAERS is only for reporting reactions, and VAERS staff do not give medical advice. 6. The National Vaccine Injury Compensation Program The [employer] Vaccine Injury Compensation Program (VICP) is a federal program that was created to compensate people who may have been injured by certain vaccines. Visit the VICP website at GoldCloset.com.ee or call 502-426-5949 to learn about the program and about filing a claim. There is a time limit to file a claim for compensation. 7. How can I learn more?  Ask your health care provider.  Call your local or state health department.  Contact the Centers for Disease Control and Prevention (CDC): ? Call 785-835-8670 (1-800-CDC-INFO) or ? Visit CDC's website at http://hunter.com/ Vaccine Information Statement Td Vaccine (12/22/18) This information is not intended to replace advice given  to you by your health care provider. Make sure you discuss any questions you have with your health care provider. Document Revised: 01/31/2019 Document Reviewed: 01/03/2019 Elsevier  Patient Education  2020 [employer].   [facility]  [place], [place], [place] [address]  Phone: [phone]   [facility] (second location)  [address] [place], [place] [address]  Phone: [phone]   [facility]) [place], [place], [place] [address] Phone: [phone]   [facility] [place], [place] [address]  Phone: [phone]   Round Ligament Pain  The round ligament is a cord of muscle and tissue that helps support the uterus. It can become a source of pain during pregnancy if it becomes stretched or twisted as the baby grows. The pain usually begins in the second trimester (13-28 weeks) of pregnancy, and it can come and go until the baby is delivered. It is not a serious problem, and it does not cause harm to the baby. Round ligament pain is usually a short, sharp, and pinching pain, but it can also be a dull, lingering, and aching pain. The pain is felt in the lower side of the abdomen or in the groin. It usually starts deep in the groin and moves up to the outside of the hip area. The pain may occur when you:  Suddenly change position, such as quickly going from a sitting to standing position.  Roll over in bed.  Cough or sneeze.  Do physical activity. Follow these instructions at home:   Watch your condition for any changes.  When the pain starts, relax. Then try any of these methods to help with the pain: ? Sitting down. ? Flexing your knees up to your abdomen. ? Lying on your side with one pillow under your abdomen and another pillow between your legs. ? Sitting in a warm bath for 15-20 minutes or until the pain goes away.  Take over-the-counter and prescription medicines only as told by your health care provider.  Move slowly when you sit down or stand up.  Avoid long walks if they cause pain.  Stop or reduce your physical  activities if they cause pain.  Keep all follow-up visits as told by your health care provider. This is important. Contact a health care provider if:  Your pain does not go away with treatment.  You feel pain in your back that you did not have before.  Your medicine is not helping. Get help right away if:  You have a fever or chills.  You develop uterine contractions.  You have vaginal bleeding.  You have nausea or vomiting.  You have diarrhea.  You have pain when you urinate. Summary  Round ligament pain is felt in the lower abdomen or groin. It is usually a short, sharp, and pinching pain. It can also be a dull, lingering, and aching pain.  This pain usually begins in the second trimester (13-28 weeks). It occurs because the uterus is stretching with the growing baby, and it is not harmful to the baby.  You may notice the pain when you suddenly change position, when you cough or sneeze, or during physical activity.  Relaxing, flexing your knees to your abdomen, lying on one side, or taking a warm bath may help to get rid of the pain.  Get help from your health care provider if the pain does not go away or if you  have vaginal bleeding, nausea, vomiting, diarrhea, or painful urination. This information is not intended to replace advice given to you by your health care provider. Make sure you discuss any questions you have with your health care provider. Document Revised: 02/24/2018 Document Reviewed: 02/24/2018 Elsevier Patient Education  [place].   Back Pain in Pregnancy Back pain during pregnancy is common. Back pain may be caused by several factors that are related to changes during your pregnancy. Follow these instructions at home: Managing pain, stiffness, and swelling      If directed, for sudden (acute) back pain, put ice on the painful area. ? Put ice in a plastic bag. ? Place a towel between your skin and the bag. ? Leave the ice on for 20  minutes, 2-3 times per day.  If directed, apply heat to the affected area before you exercise. Use the heat source that your health care provider recommends, such as a moist heat pack or a heating pad. ? Place a towel between your skin and the heat source. ? Leave the heat on for 20-30 minutes. ? Remove the heat if your skin turns bright red. This is especially important if you are unable to feel pain, heat, or cold. You may have a greater risk of getting burned.  If directed, massage the affected area. Activity  Exercise as told by your health care provider. Gentle exercise is the best way to prevent or manage back pain.  Listen to your body when lifting. If lifting hurts, ask for help or bend your knees. This uses your leg muscles instead of your back muscles.  Squat down when picking up something from the floor. Do not bend over.  Only use bed rest for short periods as told by your health care provider. Bed rest should only be used for the most severe episodes of back pain. Standing, sitting, and lying down  Do not stand in one place for long periods of time.  Use good posture when sitting. Make sure your head rests over your shoulders and is not hanging forward. Use a pillow on your lower back if necessary.  Try sleeping on your side, preferably the left side, with a pregnancy support pillow or 1-2 regular pillows between your legs. ? If you have back pain after a night's rest, your bed may be too soft. ? A firm mattress may provide more support for your back during pregnancy. General instructions  Do not wear high heels.  Eat a healthy diet. Try to gain weight within your health care provider's recommendations.  Use a maternity girdle, elastic sling, or back brace as told by your health care provider.  Take over-the-counter and prescription medicines only as told by your health care provider.  Work with a physical therapist or massage therapist to find ways to manage back  pain. Acupuncture or massage therapy may be helpful.  Keep all follow-up visits as told by your health care provider. This is important. Contact a health care provider if:  Your back pain interferes with your daily activities.  You have increasing pain in other parts of your body. Get help right away if:  You develop numbness, tingling, weakness, or problems with the use of your arms or legs.  You develop severe back pain that is not controlled with medicine.  You have a change in bowel or bladder control.  You develop shortness of breath, dizziness, or you faint.  You develop nausea, vomiting, or sweating.  You have back pain  that is a rhythmic, cramping pain similar to labor pains. Labor pain is usually 1-2 minutes apart, lasts for about 1 minute, and involves a bearing down feeling or pressure in your pelvis.  You have back pain and your water breaks or you have vaginal bleeding.  You have back pain or numbness that travels down your leg.  Your back pain developed after you fell.  You develop pain on one side of your back.  You see blood in your urine.  You develop skin blisters in the area of your back pain. Summary  Back pain may be caused by several factors that are related to changes during your pregnancy.  Follow instructions as told by your health care provider for managing pain, stiffness, and swelling.  Exercise as told by your health care provider. Gentle exercise is the best way to prevent or manage back pain.  Take over-the-counter and prescription medicines only as told by your health care provider.  Keep all follow-up visits as told by your health care provider. This is important. This information is not intended to replace advice given to you by your health care provider. Make sure you discuss any questions you have with your health care provider. Document Revised: 12/28/2018 Document Reviewed: 02/24/2018 Elsevier Patient Education  2020 [employer].   Contraception Choices Contraception, also called birth control, means things to use or ways to try not to get pregnant. Hormonal birth control This kind of birth control uses hormones. Here are some types of hormonal birth control:  A tube that is put under skin of the arm (implant). The tube can stay in for as long as 3 years.  Shots to get every 3 months (injections).  Pills to take every day (birth control pills).  A patch to change 1 time each week for 3 weeks (birth control patch). After that, the patch is taken off for 1 week.  A ring to put in the vagina. The ring is left in for 3 weeks. Then it is taken out of the vagina for 1 week. Then a new ring is put in.  Pills to take after unprotected sex (emergency birth control pills). Barrier birth control Here are some types of barrier birth control:  A thin covering that is put on the penis before sex (female condom). The covering is thrown away after sex.  A soft, loose covering that is put in the vagina before sex (female condom). The covering is thrown away after sex.  A rubber bowl that sits over the cervix (diaphragm). The bowl must be made for you. The bowl is put into the vagina before sex. The bowl is left in for 6-8 hours after sex. It is taken out within 24 hours.  A small, soft cup that fits over the cervix (cervical cap). The cup must be made for you. The cup can be left in for 6-8 hours after sex. It is taken out within 48 hours.  A sponge that is put into the vagina before sex. It must be left in for at least 6 hours after sex. It must be taken out within 30 hours. Then it is thrown away.  A chemical that kills or stops sperm from getting into the uterus (spermicide). It may be a pill, cream, jelly, or foam to put in the vagina. The chemical should be used at least 10-15 minutes before sex. IUD (intrauterine) birth control An IUD is a small, T-shaped piece of plastic. It is put inside the uterus.  There are two  kinds:  Hormone IUD. This kind can stay in for 3-5 years.  Copper IUD. This kind can stay in for 10 years. Permanent birth control Here are some types of permanent birth control:  Surgery to block the fallopian tubes.  Having an insert put into each fallopian tube.  Surgery to tie off the tubes that carry sperm (vasectomy). Natural planning birth control Here are some types of natural planning birth control:  Not having sex on the days the woman could get pregnant.  Using a calendar: ? To keep track of the length of each period. ? To find out what days pregnancy can happen. ? To plan to not have sex on days when pregnancy can happen.  Watching for symptoms of ovulation and not having sex during ovulation. One way the woman can check for ovulation is to check her temperature.  Waiting to have sex until after ovulation. Summary  Contraception, also called birth control, means things to use or ways to try not to get pregnant.  Hormonal methods of birth control include implants, injections, pills, patches, vaginal rings, and emergency birth control pills.  Barrier methods of birth control can include female condoms, female condoms, diaphragms, cervical caps, sponges, and spermicides.  There are two types of IUD (intrauterine device) birth control. An IUD can be put in a woman's uterus to prevent pregnancy for 3-5 years.  Permanent sterilization can be done through a procedure for males, females, or both.  Natural planning methods involve not having sex on the days when the woman could get pregnant. This information is not intended to replace advice given to you by your health care provider. Make sure you discuss any questions you have with your health care provider. Document Revised: 12/29/2018 Document Reviewed: 09/18/2016 Elsevier Patient Education  [place].   Pain Relief During Labor and Delivery Many things can cause pain during labor and delivery,  including:  Pressure on bones and ligaments due to the baby moving through the pelvis.  Stretching of tissues due to the baby moving through the birth canal.  Muscle tension due to anxiety or nervousness.  The uterus tightening (contracting) and relaxing to help move the baby. There are many ways to deal with the pain of labor and delivery. They include:  Taking prenatal classes. Taking these classes helps you know what to expect during your baby's birth. What you learn will increase your confidence and decrease your anxiety.  Practicing relaxation techniques or doing relaxing activities, such as: ? Focused breathing. ? Meditation. ? Visualization. ? Aroma therapy. ? Listening to your favorite music. ? Hypnosis.  Taking a warm shower or bath (hydrotherapy). This may: ? Provide comfort and relaxation. ? Lessen your perception of pain. ? Decrease the amount of pain medicine needed. ? Decrease the length of labor.  Getting a massage or counterpressure on your back.  Applying warm packs or ice packs.  Changing positions often, moving around, or using a birthing ball.  Getting: ? Pain medicine through an IV or injection into a muscle. ? Pain medicine inserted into your spinal column. ? Injections of sterile water just under the skin on your lower back (intradermal injections). ? Laughing gas (nitrous oxide). Discuss your pain control options with your health care provider during your prenatal visits. Explore the options offered by your hospital or birth center. What kinds of medicine are available? There are two kinds of medicines that can be used to relieve pain during labor and delivery:  Analgesics. These medicines decrease pain without causing you to lose feeling or the ability to move your muscles.  Anesthetics. These medicines block feeling in the body and can decrease your ability to move freely. Both of these kinds of medicine can cause minor side effects, such as  nausea, trouble concentrating, and sleepiness. They can also decrease the baby's heart rate before birth and affect the baby's breathing rate after birth. For this reason, health care providers are careful about when and how much medicine is given. What are specific medicines and procedures that provide pain relief? Local Anesthetics Local anesthetics are used to numb a small area of the body. They may be used along with another kind of anesthetic or used to numb the nerves of the vagina, cervix, and perineum during the second stage of labor. General Anesthetics General anesthetics cause you to lose consciousness so you do not feel pain. They are usually only used for an emergency cesarean delivery. General anesthetics are given through an IV tube and a mask. Pudendal Block A pudendal block is a form of local anesthetic. It may be used to relieve the pain associated with pushing or stretching of the perineum at the time of delivery or to further numb the perineum. A pudendal block is done by injecting numbing medicine through the vaginal wall into a nerve in the pelvis. Epidural Analgesia Epidural analgesia is given through a flexible IV catheter that is inserted into the lower back. Numbing medicine is delivered continuously to the area near your spinal column nerves (epidural space). After having this type of analgesia, you may be able to move your legs but you most likely will not be able to walk. Depending on the amount of medicine given, you may lose all feeling in the lower half of your body, or you may retain some level of sensation, including the urge to push. Epidural analgesia can be used to provide pain relief for a vaginal birth. Spinal Block A spinal block is similar to epidural analgesia, but the medicine is injected into the spinal fluid instead of the epidural space. A spinal block is only given once. It starts to relieve pain quickly, but the pain relief lasts only 1-6 hours. Spinal  blocks can be used for cesarean deliveries. Combined Spinal-Epidural (CSE) Block A CSE block combines the effects of a spinal block and epidural analgesia. The spinal block works quickly to block all pain. The epidural analgesia provides continuous pain relief, even after the effects of the spinal block have worn off. This information is not intended to replace advice given to you by your health care provider. Make sure you discuss any questions you have with your health care provider. Document Revised: 08/21/2017 Document Reviewed: 01/30/2016 Elsevier Patient Education  [place].   Breastfeeding  Choosing to breastfeed is one of the best decisions you can make for yourself and your baby. A change in hormones during pregnancy causes your breasts to make breast milk in your milk-producing glands. Hormones prevent breast milk from being released before your baby is born. They also prompt milk flow after birth. Once breastfeeding has begun, thoughts of your baby, as well as his or her sucking or crying, can stimulate the release of milk from your milk-producing glands. Benefits of breastfeeding Research shows that breastfeeding offers many health benefits for infants and mothers. It also offers a cost-free and convenient way to feed your baby. For your baby  Your first milk (colostrum) helps your baby's digestive system to  function better.  Special cells in your milk (antibodies) help your baby to fight off infections.  Breastfed babies are less likely to develop asthma, allergies, obesity, or type 2 diabetes. They are also at lower risk for sudden infant death syndrome (SIDS).  Nutrients in breast milk are better able to meet your baby's needs compared to infant formula.  Breast milk improves your baby's brain development. For you  Breastfeeding helps to create a very special bond between you and your baby.  Breastfeeding is convenient. Breast milk costs nothing and is always  available at the correct temperature.  Breastfeeding helps to burn calories. It helps you to lose the weight that you gained during pregnancy.  Breastfeeding makes your uterus return faster to its size before pregnancy. It also slows bleeding (lochia) after you give birth.  Breastfeeding helps to lower your risk of developing type 2 diabetes, osteoporosis, rheumatoid arthritis, cardiovascular disease, and breast, ovarian, uterine, and endometrial cancer later in life. Breastfeeding basics Starting breastfeeding  Find a comfortable place to sit or lie down, with your neck and back well-supported.  Place a pillow or a rolled-up blanket under your baby to bring him or her to the level of your breast (if you are seated). Nursing pillows are specially designed to help support your arms and your baby while you breastfeed.  Make sure that your baby's tummy (abdomen) is facing your abdomen.  Gently massage your breast. With your fingertips, massage from the outer edges of your breast inward toward the nipple. This encourages milk flow. If your milk flows slowly, you may need to continue this action during the feeding.  Support your breast with 4 fingers underneath and your thumb above your nipple (make the letter "C" with your hand). Make sure your fingers are well away from your nipple and your baby's mouth.  Stroke your baby's lips gently with your finger or nipple.  When your baby's mouth is open wide enough, quickly bring your baby to your breast, placing your entire nipple and as much of the areola as possible into your baby's mouth. The areola is the colored area around your nipple. ? More areola should be visible above your baby's upper lip than below the lower lip. ? Your baby's lips should be opened and extended outward (flanged) to ensure an adequate, comfortable latch. ? Your baby's tongue should be between his or her lower gum and your breast.  Make sure that your baby's mouth is  correctly positioned around your nipple (latched). Your baby's lips should create a seal on your breast and be turned out (everted).  It is common for your baby to suck about 2-3 minutes in order to start the flow of breast milk. Latching Teaching your baby how to latch onto your breast properly is very important. An improper latch can cause nipple pain, decreased milk supply, and poor weight gain in your baby. Also, if your baby is not latched onto your nipple properly, he or she may swallow some air during feeding. This can make your baby fussy. Burping your baby when you switch breasts during the feeding can help to get rid of the air. However, teaching your baby to latch on properly is still the best way to prevent fussiness from swallowing air while breastfeeding. Signs that your baby has successfully latched onto your nipple  Silent tugging or silent sucking, without causing you pain. Infant's lips should be extended outward (flanged).  Swallowing heard between every 3-4 sucks once your milk has  started to flow (after your let-down milk reflex occurs).  Muscle movement above and in front of his or her ears while sucking. Signs that your baby has not successfully latched onto your nipple  Sucking sounds or smacking sounds from your baby while breastfeeding.  Nipple pain. If you think your baby has not latched on correctly, slip your finger into the corner of your baby's mouth to break the suction and place it between your baby's gums. Attempt to start breastfeeding again. Signs of successful breastfeeding Signs from your baby  Your baby will gradually decrease the number of sucks or will completely stop sucking.  Your baby will fall asleep.  Your baby's body will relax.  Your baby will retain a small amount of milk in his or her mouth.  Your baby will let go of your breast by himself or herself. Signs from you  Breasts that have increased in firmness, weight, and size 1-3 hours  after feeding.  Breasts that are softer immediately after breastfeeding.  Increased milk volume, as well as a change in milk consistency and color by the fifth day of breastfeeding.  Nipples that are not sore, cracked, or bleeding. Signs that your baby is getting enough milk  Wetting at least 1-2 diapers during the first 24 hours after birth.  Wetting at least 5-6 diapers every 24 hours for the first week after birth. The urine should be clear or pale yellow by the age of 5 days.  Wetting 6-8 diapers every 24 hours as your baby continues to grow and develop.  At least 3 stools in a 24-hour period by the age of 5 days. The stool should be soft and yellow.  At least 3 stools in a 24-hour period by the age of 7 days. The stool should be seedy and yellow.  No loss of weight greater than 10% of birth weight during the first 3 days of life.  Average weight gain of 4-7 oz (113-198 g) per week after the age of 4 days.  Consistent daily weight gain by the age of 5 days, without weight loss after the age of 2 weeks. After a feeding, your baby may spit up a small amount of milk. This is normal. Breastfeeding frequency and duration Frequent feeding will help you make more milk and can prevent sore nipples and extremely full breasts (breast engorgement). Breastfeed when you feel the need to reduce the fullness of your breasts or when your baby shows signs of hunger. This is called "breastfeeding on demand." Signs that your baby is hungry include:  Increased alertness, activity, or restlessness.  Movement of the head from side to side.  Opening of the mouth when the corner of the mouth or cheek is stroked (rooting).  Increased sucking sounds, smacking lips, cooing, sighing, or squeaking.  Hand-to-mouth movements and sucking on fingers or hands.  Fussing or crying. Avoid introducing a pacifier to your baby in the first 4-6 weeks after your baby is born. After this time, you may choose to use  a pacifier. Research has shown that pacifier use during the first year of a baby's life decreases the risk of sudden infant death syndrome (SIDS). Allow your baby to feed on each breast as long as he or she wants. When your baby unlatches or falls asleep while feeding from the first breast, offer the second breast. Because newborns are often sleepy in the first few weeks of life, you may need to awaken your baby to get him or her  to feed. Breastfeeding times will vary from baby to baby. However, the following rules can serve as a guide to help you make sure that your baby is properly fed:  Newborns (babies 29 weeks of age or younger) may breastfeed every 1-3 hours.  Newborns should not go without breastfeeding for longer than 3 hours during the day or 5 hours during the night.  You should breastfeed your baby a minimum of 8 times in a 24-hour period. Breast milk pumping     Pumping and storing breast milk allows you to make sure that your baby is exclusively fed your breast milk, even at times when you are unable to breastfeed. This is especially important if you go back to work while you are still breastfeeding, or if you are not able to be present during feedings. Your lactation consultant can help you find a method of pumping that works best for you and give you guidelines about how long it is safe to store breast milk. Caring for your breasts while you breastfeed Nipples can become dry, cracked, and sore while breastfeeding. The following recommendations can help keep your breasts moisturized and healthy:  Avoid using soap on your nipples.  Wear a supportive bra designed especially for nursing. Avoid wearing underwire-style bras or extremely tight bras (sports bras).  Air-dry your nipples for 3-4 minutes after each feeding.  Use only cotton bra pads to absorb leaked breast milk. Leaking of breast milk between feedings is normal.  Use lanolin on your nipples after breastfeeding. Lanolin  helps to maintain your skin's normal moisture barrier. Pure lanolin is not harmful (not toxic) to your baby. You may also hand express a few drops of breast milk and gently massage that milk into your nipples and allow the milk to air-dry. In the first few weeks after giving birth, some women experience breast engorgement. Engorgement can make your breasts feel heavy, warm, and tender to the touch. Engorgement peaks within 3-5 days after you give birth. The following recommendations can help to ease engorgement:  Completely empty your breasts while breastfeeding or pumping. You may want to start by applying warm, moist heat (in the shower or with warm, water-soaked hand towels) just before feeding or pumping. This increases circulation and helps the milk flow. If your baby does not completely empty your breasts while breastfeeding, pump any extra milk after he or she is finished.  Apply ice packs to your breasts immediately after breastfeeding or pumping, unless this is too uncomfortable for you. To do this: ? Put ice in a plastic bag. ? Place a towel between your skin and the bag. ? Leave the ice on for 20 minutes, 2-3 times a day.  Make sure that your baby is latched on and positioned properly while breastfeeding. If engorgement persists after 48 hours of following these recommendations, contact your health care provider or a Science writer. Overall health care recommendations while breastfeeding  Eat 3 healthy meals and 3 snacks every day. Well-nourished mothers who are breastfeeding need an additional 450-500 calories a day. You can meet this requirement by increasing the amount of a balanced diet that you eat.  Drink enough water to keep your urine pale yellow or clear.  Rest often, relax, and continue to take your prenatal vitamins to prevent fatigue, stress, and low vitamin and mineral levels in your body (nutrient deficiencies).  Do not use any products that contain nicotine or  tobacco, such as cigarettes and e-cigarettes. Your baby may be harmed by  chemicals from cigarettes that pass into breast milk and exposure to secondhand smoke. If you need help quitting, ask your health care provider.  Avoid alcohol.  Do not use illegal drugs or marijuana.  Talk with your health care provider before taking any medicines. These include over-the-counter and prescription medicines as well as vitamins and herbal supplements. Some medicines that may be harmful to your baby can pass through breast milk.  It is possible to become pregnant while breastfeeding. If birth control is desired, ask your health care provider about options that will be safe while breastfeeding your baby. Where to find more information: [employer]: www.llli.org Contact a health care provider if:  You feel like you want to stop breastfeeding or have become frustrated with breastfeeding.  Your nipples are cracked or bleeding.  Your breasts are red, tender, or warm.  You have: ? Painful breasts or nipples. ? A swollen area on either breast. ? A fever or chills. ? Nausea or vomiting. ? Drainage other than breast milk from your nipples.  Your breasts do not become full before feedings by the fifth day after you give birth.  You feel sad and depressed.  Your baby is: ? Too sleepy to eat well. ? Having trouble sleeping. ? More than 60 week old and wetting fewer than 6 diapers in a 24-hour period. ? Not gaining weight by 40 days of age.  Your baby has fewer than 3 stools in a 24-hour period.  Your baby's skin or the white parts of his or her eyes become yellow. Get help right away if:  Your baby is overly tired (lethargic) and does not want to wake up and feed.  Your baby develops an unexplained fever. Summary  Breastfeeding offers many health benefits for infant and mothers.  Try to breastfeed your infant when he or she shows early signs of hunger.  Gently tickle or stroke  your baby's lips with your finger or nipple to allow the baby to open his or her mouth. Bring the baby to your breast. Make sure that much of the areola is in your baby's mouth. Offer one side and burp the baby before you offer the other side.  Talk with your health care provider or lactation consultant if you have questions or you face problems as you breastfeed. This information is not intended to replace advice given to you by your health care provider. Make sure you discuss any questions you have with your health care provider. Document Revised: 12/03/2017 Document Reviewed: 10/10/2016 Elsevier Patient Education  [employer].   Common Medications Safe in Pregnancy  Acne:      Constipation:  Benzoyl Peroxide     Colace  Clindamycin      Dulcolax Suppository  Topica Erythromycin     Fibercon  Salicylic Acid      Metamucil         Miralax AVOID:        Senakot   Accutane    Cough:  Retin-A       Cough Drops  Tetracycline      Phenergan w/ Codeine if Rx  Minocycline      Robitussin (Plain & DM)  Antibiotics:     Crabs/Lice:  Ceclor       RID  Cephalosporins    AVOID:  E-Mycins      Kwell  Keflex  Macrobid/Macrodantin   Diarrhea:  Penicillin      Kao-Pectate  Zithromax  Imodium AD         PUSH FLUIDS AVOID:       Cipro     Fever:  Tetracycline      Tylenol (Regular or Extra  Minocycline       Strength)  Levaquin      Extra Strength-Do not          Exceed 8 tabs/24 hrs Caffeine:        <249m/day (equiv. To 1 cup of coffee or  approx. 3 12 oz sodas)         Gas: Cold/Hayfever:       Gas-X  Benadryl      Mylicon  Claritin       Phazyme  **Claritin-D        Chlor-Trimeton    Headaches:  Dimetapp      ASA-Free Excedrin  Drixoral-Non-Drowsy     Cold Compress  Mucinex (Guaifenasin)     Tylenol (Regular or Extra  Sudafed/Sudafed-12 Hour     Strength)  **Sudafed PE Pseudoephedrine   Tylenol Cold & Sinus     Vicks Vapor Rub  Zyrtec  **AVOID if Problems With  Blood Pressure         Heartburn: Avoid lying down for at least 1 hour after meals  Aciphex      Maalox     Rash:  Milk of Magnesia     Benadryl    Mylanta       1% Hydrocortisone Cream  Pepcid  Pepcid Complete   Sleep Aids:  Prevacid      Ambien   Prilosec       Benadryl  Rolaids       Chamomile Tea  Tums (Limit 4/day)     Unisom  Zantac       Tylenol PM         Warm milk-add vanilla or  Hemorrhoids:       Sugar for taste  Anusol/Anusol H.C.  (RX: Analapram 2.5%)  Sugar Substitutes:  Hydrocortisone OTC     Ok in moderation  Preparation H      Tucks        Vaseline lotion applied to tissue with wiping    Herpes:     Throat:  Acyclovir      Oragel  Famvir  Valtrex     Vaccines:         Flu Shot Leg Cramps:       *Gardasil  Benadryl      Hepatitis A         Hepatitis B Nasal Spray:       Pneumovax  Saline Nasal Spray     Polio Booster         Tetanus Nausea:       Tuberculosis test or PPD  Vitamin B6 25 mg TID   AVOID:    Dramamine      *Gardasil  Emetrol       Live Poliovirus  Ginger Root 250 mg QID    MMR (measles, mumps &  High Complex Carbs @ Bedtime    rebella)  Sea Bands-Accupressure    Varicella (Chickenpox)  Unisom 1/2 tab TID     *No known complications           If received before Pain:         Known pregnancy;   Darvocet       Resume series after  Lortab        Delivery  Percocet  Yeast:   Tramadol      Femstat  Tylenol 3      Gyne-lotrimin  Ultram       Monistat  Vicodin           MISC:         All Sunscreens           Hair Coloring/highlights          Insect Repellant's          (Including DEET)         Mystic Tans    Second Trimester of Pregnancy  The second trimester is from week 14 through week 27 (month 4 through 6). This is often the time in pregnancy that you feel your best. Often times, morning sickness has lessened or quit. You may have more energy, and you may get hungry more often. Your unborn baby is growing rapidly. At the  end of the sixth month, he or she is about 9 inches long and weighs about 1 pounds. You will likely feel the baby move between 18 and 20 weeks of pregnancy. Follow these instructions at home: Medicines  Take over-the-counter and prescription medicines only as told by your doctor. Some medicines are safe and some medicines are not safe during pregnancy.  Take a prenatal vitamin that contains at least 600 micrograms (mcg) of folic acid.  If you have trouble pooping (constipation), take medicine that will make your stool soft (stool softener) if your doctor approves. Eating and drinking   Eat regular, healthy meals.  Avoid raw meat and uncooked cheese.  If you get low calcium from the food you eat, talk to your doctor about taking a daily calcium supplement.  Avoid foods that are high in fat and sugars, such as fried and sweet foods.  If you feel sick to your stomach (nauseous) or throw up (vomit): ? Eat 4 or 5 small meals a day instead of 3 large meals. ? Try eating a few soda crackers. ? Drink liquids between meals instead of during meals.  To prevent constipation: ? Eat foods that are high in fiber, like fresh fruits and vegetables, whole grains, and beans. ? Drink enough fluids to keep your pee (urine) clear or pale yellow. Activity  Exercise only as told by your doctor. Stop exercising if you start to have cramps.  Do not exercise if it is too hot, too humid, or if you are in a place of great height (high altitude).  Avoid heavy lifting.  Wear low-heeled shoes. Sit and stand up straight.  You can continue to have sex unless your doctor tells you not to. Relieving pain and discomfort  Wear a good support bra if your breasts are tender.  Take warm water baths (sitz baths) to soothe pain or discomfort caused by hemorrhoids. Use hemorrhoid cream if your doctor approves.  Rest with your legs raised if you have leg cramps or low back pain.  If you develop puffy, bulging  veins (varicose veins) in your legs: ? Wear support hose or compression stockings as told by your doctor. ? Raise (elevate) your feet for 15 minutes, 3-4 times a day. ? Limit salt in your food. Prenatal care  Write down your questions. Take them to your prenatal visits.  Keep all your prenatal visits as told by your doctor. This is important. Safety  Wear your seat belt when driving.  Make a list of emergency phone numbers, including numbers for family, friends, the hospital, and police  and Public relations account executive. General instructions  Ask your doctor about the right foods to eat or for help finding a counselor, if you need these services.  Ask your doctor about local prenatal classes. Begin classes before month 6 of your pregnancy.  Do not use hot tubs, steam rooms, or saunas.  Do not douche or use tampons or scented sanitary pads.  Do not cross your legs for long periods of time.  Visit your dentist if you have not done so. Use a soft toothbrush to brush your teeth. Floss gently.  Avoid all smoking, herbs, and alcohol. Avoid drugs that are not approved by your doctor.  Do not use any products that contain nicotine or tobacco, such as cigarettes and e-cigarettes. If you need help quitting, ask your doctor.  Avoid cat litter boxes and soil used by cats. These carry germs that can cause birth defects in the baby and can cause a loss of your baby (miscarriage) or stillbirth. Contact a doctor if:  You have mild cramps or pressure in your lower belly.  You have pain when you pee (urinate).  You have bad smelling fluid coming from your vagina.  You continue to feel sick to your stomach (nauseous), throw up (vomit), or have watery poop (diarrhea).  You have a nagging pain in your belly area.  You feel dizzy. Get help right away if:  You have a fever.  You are leaking fluid from your vagina.  You have spotting or bleeding from your vagina.  You have severe belly cramping or  pain.  You lose or gain weight rapidly.  You have trouble catching your breath and have chest pain.  You notice sudden or extreme puffiness (swelling) of your face, hands, ankles, feet, or legs.  You have not felt the baby move in over an hour.  You have severe headaches that do not go away when you take medicine.  You have trouble seeing. Summary  The second trimester is from week 14 through week 27 (months 4 through 6). This is often the time in pregnancy that you feel your best.  To take care of yourself and your unborn baby, you will need to eat healthy meals, take medicines only if your doctor tells you to do so, and do activities that are safe for you and your baby.  Call your doctor if you get sick or if you notice anything unusual about your pregnancy. Also, call your doctor if you need help with the right food to eat, or if you want to know what activities are safe for you. This information is not intended to replace advice given to you by your health care provider. Make sure you discuss any questions you have with your health care provider. Document Revised: 12/31/2018 Document Reviewed: 10/14/2016 Elsevier Patient Education  [place].

## 2020-03-24 NOTE — Progress Notes (Signed)
ROB-Reports round ligament pain, discussed home treatment measures. Considering waterbirth, class schedule given. Third trimester handouts given including Illustrated Birth Choices, [employer], and [facility] pamphlet. Anticipatory guidance regarding course of prenatal care. Reviewed red flag symptoms and when to call. RTC x 24 weeks for 28 week labs, TDaP, and ROB or sooner if needed.

## 2020-04-23 ENCOUNTER — Encounter: Payer: Self-pay | Admitting: Certified Nurse Midwife

## 2020-04-23 ENCOUNTER — Ambulatory Visit (INDEPENDENT_AMBULATORY_CARE_PROVIDER_SITE_OTHER): Payer: Self-pay | Admitting: Certified Nurse Midwife

## 2020-04-23 ENCOUNTER — Other Ambulatory Visit: Payer: Self-pay

## 2020-04-23 VITALS — BP 109/88 | HR 91 | Wt 170.0 lb

## 2020-04-23 DIAGNOSIS — Z3A29 29 weeks gestation of pregnancy: Secondary | ICD-10-CM

## 2020-04-23 LAB — POCT URINALYSIS DIPSTICK OB
Bilirubin, UA: NEGATIVE
Blood, UA: NEGATIVE
Glucose, UA: NEGATIVE
Ketones, UA: NEGATIVE
Leukocytes, UA: NEGATIVE
Nitrite, UA: NEGATIVE
POC,PROTEIN,UA: NEGATIVE
Spec Grav, UA: 1.01 (ref 1.010–1.025)
Urobilinogen, UA: 0.2 E.U./dL
pH, UA: 5 (ref 5.0–8.0)

## 2020-04-23 MED ORDER — TETANUS-DIPHTH-ACELL PERTUSSIS 5-2.5-18.5 LF-MCG/0.5 IM SUSP: 0.5000 mL | Freq: Once | INTRAMUSCULAR | Status: DC

## 2020-04-23 NOTE — Patient Instructions (Signed)

## 2020-04-23 NOTE — Progress Notes (Addendum)
ROB doing well. Feels good movement 28 wk labs today  BTC/RPR/CBC/Glucose screen. Discussed birth control, information given, discussed birth plan, sample given. Ready set baby given. See check list for topics reviewed. Will follow up next visit   Doreene Burke, CNM

## 2020-04-24 LAB — CBC
Hematocrit: 33.9 % — ABNORMAL LOW (ref 34.0–46.6)
Hemoglobin: 11.7 g/dL (ref 11.1–15.9)
MCH: 31.6 pg (ref 26.6–33.0)
MCHC: 34.5 g/dL (ref 31.5–35.7)
MCV: 92 fL (ref 79–97)
Platelets: 267 10*3/uL (ref 150–450)
RBC: 3.7 x10E6/uL — ABNORMAL LOW (ref 3.77–5.28)
RDW: 12.9 % (ref 11.7–15.4)
WBC: 16.5 10*3/uL — ABNORMAL HIGH (ref 3.4–10.8)

## 2020-04-24 LAB — RPR: RPR Ser Ql: NONREACTIVE

## 2020-04-24 LAB — GLUCOSE, 1 HOUR GESTATIONAL: Gestational Diabetes Screen: 80 mg/dL (ref 65–139)

## 2020-05-04 ENCOUNTER — Telehealth: Payer: Self-pay | Admitting: Certified Nurse Midwife

## 2020-05-04 NOTE — Telephone Encounter (Signed)
Patient is at the dentist and they are needing the "okay" for them to be able to perform a root canal on the patient. Patient is at Dr. Wendie Chess, DDS fax number is [phone].  Could you please advise?

## 2020-05-04 NOTE — Telephone Encounter (Signed)
Dental Letter faxed over.

## 2020-05-04 NOTE — Telephone Encounter (Signed)
Dental letter completed. Please fax. Thanks, Serafina Royals, CNM

## 2020-05-17 ENCOUNTER — Other Ambulatory Visit: Payer: Self-pay

## 2020-05-17 ENCOUNTER — Ambulatory Visit (INDEPENDENT_AMBULATORY_CARE_PROVIDER_SITE_OTHER): Payer: Self-pay | Admitting: Certified Nurse Midwife

## 2020-05-17 ENCOUNTER — Encounter: Payer: Self-pay | Admitting: Certified Nurse Midwife

## 2020-05-17 VITALS — BP 119/72 | HR 86 | Wt 172.5 lb

## 2020-05-17 DIAGNOSIS — Z3403 Encounter for supervision of normal first pregnancy, third trimester: Secondary | ICD-10-CM

## 2020-05-17 DIAGNOSIS — Z3A32 32 weeks gestation of pregnancy: Secondary | ICD-10-CM

## 2020-05-17 LAB — POCT URINALYSIS DIPSTICK OB
Bilirubin, UA: NEGATIVE
Blood, UA: NEGATIVE
Glucose, UA: NEGATIVE
Ketones, UA: NEGATIVE
Leukocytes, UA: NEGATIVE
Nitrite, UA: NEGATIVE
POC,PROTEIN,UA: NEGATIVE
Spec Grav, UA: 1.01 (ref 1.010–1.025)
Urobilinogen, UA: 0.2 E.U./dL
pH, UA: 7 (ref 5.0–8.0)

## 2020-05-17 NOTE — Progress Notes (Signed)
ROB-Doing well. Enrolled in breastfeeding class next week. Breastfeeding education survey results reviewed with patient; questions answered and handout provided. Anticipatory guidance regarding course of prenatal care. Reviewed red flag symptoms and when to call. RTC x 2 weeks for ROB or sooner if needed.

## 2020-05-17 NOTE — Patient Instructions (Addendum)
Fetal Movement Counts Patient Name: ________________________________________________ Patient Due Date: ____________________ What is a fetal movement count?  A fetal movement count is the number of times that you feel your baby move during a certain amount of time. This may also be called a fetal kick count. A fetal movement count is recommended for every pregnant woman. You may be asked to start counting fetal movements as early as week 28 of your pregnancy. Pay attention to when your baby is most active. You may notice your baby's sleep and wake cycles. You may also notice things that make your baby move more. You should do a fetal movement count:  When your baby is normally most active.  At the same time each day. A good time to count movements is while you are resting, after having something to eat and drink. How do I count fetal movements? 1. Find a quiet, comfortable area. Sit, or lie down on your side. 2. Write down the date, the start time and stop time, and the number of movements that you felt between those two times. Take this information with you to your health care visits. 3. Write down your start time when you feel the first movement. 4. Count kicks, flutters, swishes, rolls, and jabs. You should feel at least 10 movements. 5. You may stop counting after you have felt 10 movements, or if you have been counting for 2 hours. Write down the stop time. 6. If you do not feel 10 movements in 2 hours, contact your health care provider for further instructions. Your health care provider may want to do additional tests to assess your baby's well-being. Contact a health care provider if:  You feel fewer than 10 movements in 2 hours.  Your baby is not moving like he or she usually does. Date: ____________ Start time: ____________ Stop time: ____________ Movements: ____________ Date: ____________ Start time: ____________ Stop time: ____________ Movements: ____________ Date: ____________  Start time: ____________ Stop time: ____________ Movements: ____________ Date: ____________ Start time: ____________ Stop time: ____________ Movements: ____________ Date: ____________ Start time: ____________ Stop time: ____________ Movements: ____________ Date: ____________ Start time: ____________ Stop time: ____________ Movements: ____________ Date: ____________ Start time: ____________ Stop time: ____________ Movements: ____________ Date: ____________ Start time: ____________ Stop time: ____________ Movements: ____________ Date: ____________ Start time: ____________ Stop time: ____________ Movements: ____________ This information is not intended to replace advice given to you by your health care provider. Make sure you discuss any questions you have with your health care provider. Document Revised: 04/28/2019 Document Reviewed: 04/28/2019 Elsevier Patient Education  2020 Elsevier Inc.   Group B Streptococcus Test During Pregnancy Why am I having this test? Routine testing, also called screening, for group B streptococcus (GBS) is recommended for all pregnant women between the 36th and 37th week of pregnancy. GBS is a type of bacteria that can be passed from mother to baby during childbirth. Screening will help guide whether or not you will need treatment during labor and delivery to prevent complications such as:  An infection in your uterus during labor.  An infection in your uterus after delivery.  A serious infection in your baby after delivery, such as pneumonia, meningitis, or sepsis. GBS screening is not often done before 36 weeks of pregnancy unless you go into labor prematurely. What happens if I have group B streptococcus? If testing shows that you have GBS, your health care provider will recommend treatment with IV antibiotics during labor and delivery. This treatment significantly decreases the risk of  complications for you and your baby. If you have a planned C-section and  you have GBS, you may not need to be treated with antibiotics because GBS is usually passed to babies after labor starts and your water breaks. If you are in labor or your water breaks before your C-section, it is possible for GBS to get into your uterus and be passed to your baby, so you might need treatment. Is there a chance I may not need to be tested? You may not need to be tested for GBS if:  You have a urine test that shows GBS before 36 to 37 weeks.  You had a baby with GBS infection after a previous delivery. In these cases, you will automatically be treated for GBS during labor and delivery. What is being tested? This test is done to check if you have group B streptococcus in your vagina or rectum. What kind of sample is taken? To collect samples for this test, your health care provider will swab your vagina and rectum with a cotton swab. The sample is then sent to the lab to see if GBS is present. What happens during the test?   You will remove your clothing from the waist down.  You will lie down on an exam table in the same position as you would for a pelvic exam.  Your health care provider will swab your vagina and rectum to collect samples for a culture test.  You will be able to go home after the test and do all your usual activities. How are the results reported? The test results are reported as positive or negative. What do the results mean?  A positive test means you are at risk for passing GBS to your baby during labor and delivery. Your health care provider will recommend that you are treated with an IV antibiotic during labor and delivery.  A negative test means you are at very low risk of passing GBS to your baby. There is still a low risk of passing GBS to your baby because sometimes test results may report that you do not have a condition when you do (false-negative result) or there is a chance that you may become infected with GBS after the test is done. You most  likely will not need to be treated with an antibiotic during labor and delivery. Talk with your health care provider about what your results mean. Questions to ask your health care provider Ask your health care provider, or the department that is doing the test:  When will my results be ready?  How will I get my results?  What are my treatment options? Summary  Routine testing (screening) for group B streptococcus (GBS) is recommended for all pregnant women between the 36th and 37th week of pregnancy.  GBS is a type of bacteria that can be passed from mother to baby during childbirth.  If testing shows that you have GBS, your health care provider will recommend that you are treated with IV antibiotics during labor and delivery. This treatment almost always prevents infection in newborns. This information is not intended to replace advice given to you by your health care provider. Make sure you discuss any questions you have with your health care provider. Document Revised: 12/30/2018 Document Reviewed: 10/06/2018 Elsevier Patient Education  2020 [employer].   Breastfeeding  Choosing to breastfeed is one of the best decisions you can make for yourself and your baby. A change in hormones during pregnancy causes your breasts  to make breast milk in your milk-producing glands. Hormones prevent breast milk from being released before your baby is born. They also prompt milk flow after birth. Once breastfeeding has begun, thoughts of your baby, as well as his or her sucking or crying, can stimulate the release of milk from your milk-producing glands. Benefits of breastfeeding Research shows that breastfeeding offers many health benefits for infants and mothers. It also offers a cost-free and convenient way to feed your baby. For your baby  Your first milk (colostrum) helps your baby's digestive system to function better.  Special cells in your milk (antibodies) help your baby to fight off  infections.  Breastfed babies are less likely to develop asthma, allergies, obesity, or type 2 diabetes. They are also at lower risk for sudden infant death syndrome (SIDS).  Nutrients in breast milk are better able to meet your baby's needs compared to infant formula.  Breast milk improves your baby's brain development. For you  Breastfeeding helps to create a very special bond between you and your baby.  Breastfeeding is convenient. Breast milk costs nothing and is always available at the correct temperature.  Breastfeeding helps to burn calories. It helps you to lose the weight that you gained during pregnancy.  Breastfeeding makes your uterus return faster to its size before pregnancy. It also slows bleeding (lochia) after you give birth.  Breastfeeding helps to lower your risk of developing type 2 diabetes, osteoporosis, rheumatoid arthritis, cardiovascular disease, and breast, ovarian, uterine, and endometrial cancer later in life. Breastfeeding basics Starting breastfeeding  Find a comfortable place to sit or lie down, with your neck and back well-supported.  Place a pillow or a rolled-up blanket under your baby to bring him or her to the level of your breast (if you are seated). Nursing pillows are specially designed to help support your arms and your baby while you breastfeed.  Make sure that your baby's tummy (abdomen) is facing your abdomen.  Gently massage your breast. With your fingertips, massage from the outer edges of your breast inward toward the nipple. This encourages milk flow. If your milk flows slowly, you may need to continue this action during the feeding.  Support your breast with 4 fingers underneath and your thumb above your nipple (make the letter "C" with your hand). Make sure your fingers are well away from your nipple and your baby's mouth.  Stroke your baby's lips gently with your finger or nipple.  When your baby's mouth is open wide enough, quickly  bring your baby to your breast, placing your entire nipple and as much of the areola as possible into your baby's mouth. The areola is the colored area around your nipple. ? More areola should be visible above your baby's upper lip than below the lower lip. ? Your baby's lips should be opened and extended outward (flanged) to ensure an adequate, comfortable latch. ? Your baby's tongue should be between his or her lower gum and your breast.  Make sure that your baby's mouth is correctly positioned around your nipple (latched). Your baby's lips should create a seal on your breast and be turned out (everted).  It is common for your baby to suck about 2-3 minutes in order to start the flow of breast milk. Latching Teaching your baby how to latch onto your breast properly is very important. An improper latch can cause nipple pain, decreased milk supply, and poor weight gain in your baby. Also, if your baby is not latched onto  your nipple properly, he or she may swallow some air during feeding. This can make your baby fussy. Burping your baby when you switch breasts during the feeding can help to get rid of the air. However, teaching your baby to latch on properly is still the best way to prevent fussiness from swallowing air while breastfeeding. Signs that your baby has successfully latched onto your nipple  Silent tugging or silent sucking, without causing you pain. Infant's lips should be extended outward (flanged).  Swallowing heard between every 3-4 sucks once your milk has started to flow (after your let-down milk reflex occurs).  Muscle movement above and in front of his or her ears while sucking. Signs that your baby has not successfully latched onto your nipple  Sucking sounds or smacking sounds from your baby while breastfeeding.  Nipple pain. If you think your baby has not latched on correctly, slip your finger into the corner of your baby's mouth to break the suction and place it between  your baby's gums. Attempt to start breastfeeding again. Signs of successful breastfeeding Signs from your baby  Your baby will gradually decrease the number of sucks or will completely stop sucking.  Your baby will fall asleep.  Your baby's body will relax.  Your baby will retain a small amount of milk in his or her mouth.  Your baby will let go of your breast by himself or herself. Signs from you  Breasts that have increased in firmness, weight, and size 1-3 hours after feeding.  Breasts that are softer immediately after breastfeeding.  Increased milk volume, as well as a change in milk consistency and color by the fifth day of breastfeeding.  Nipples that are not sore, cracked, or bleeding. Signs that your baby is getting enough milk  Wetting at least 1-2 diapers during the first 24 hours after birth.  Wetting at least 5-6 diapers every 24 hours for the first week after birth. The urine should be clear or pale yellow by the age of 5 days.  Wetting 6-8 diapers every 24 hours as your baby continues to grow and develop.  At least 3 stools in a 24-hour period by the age of 5 days. The stool should be soft and yellow.  At least 3 stools in a 24-hour period by the age of 7 days. The stool should be seedy and yellow.  No loss of weight greater than 10% of birth weight during the first 3 days of life.  Average weight gain of 4-7 oz (113-198 g) per week after the age of 4 days.  Consistent daily weight gain by the age of 5 days, without weight loss after the age of 2 weeks. After a feeding, your baby may spit up a small amount of milk. This is normal. Breastfeeding frequency and duration Frequent feeding will help you make more milk and can prevent sore nipples and extremely full breasts (breast engorgement). Breastfeed when you feel the need to reduce the fullness of your breasts or when your baby shows signs of hunger. This is called "breastfeeding on demand." Signs that your baby  is hungry include:  Increased alertness, activity, or restlessness.  Movement of the head from side to side.  Opening of the mouth when the corner of the mouth or cheek is stroked (rooting).  Increased sucking sounds, smacking lips, cooing, sighing, or squeaking.  Hand-to-mouth movements and sucking on fingers or hands.  Fussing or crying. Avoid introducing a pacifier to your baby in the first 4-6  weeks after your baby is born. After this time, you may choose to use a pacifier. Research has shown that pacifier use during the first year of a baby's life decreases the risk of sudden infant death syndrome (SIDS). Allow your baby to feed on each breast as long as he or she wants. When your baby unlatches or falls asleep while feeding from the first breast, offer the second breast. Because newborns are often sleepy in the first few weeks of life, you may need to awaken your baby to get him or her to feed. Breastfeeding times will vary from baby to baby. However, the following rules can serve as a guide to help you make sure that your baby is properly fed:  Newborns (babies 58 weeks of age or younger) may breastfeed every 1-3 hours.  Newborns should not go without breastfeeding for longer than 3 hours during the day or 5 hours during the night.  You should breastfeed your baby a minimum of 8 times in a 24-hour period. Breast milk pumping     Pumping and storing breast milk allows you to make sure that your baby is exclusively fed your breast milk, even at times when you are unable to breastfeed. This is especially important if you go back to work while you are still breastfeeding, or if you are not able to be present during feedings. Your lactation consultant can help you find a method of pumping that works best for you and give you guidelines about how long it is safe to store breast milk. Caring for your breasts while you breastfeed Nipples can become dry, cracked, and sore while  breastfeeding. The following recommendations can help keep your breasts moisturized and healthy:  Avoid using soap on your nipples.  Wear a supportive bra designed especially for nursing. Avoid wearing underwire-style bras or extremely tight bras (sports bras).  Air-dry your nipples for 3-4 minutes after each feeding.  Use only cotton bra pads to absorb leaked breast milk. Leaking of breast milk between feedings is normal.  Use lanolin on your nipples after breastfeeding. Lanolin helps to maintain your skin's normal moisture barrier. Pure lanolin is not harmful (not toxic) to your baby. You may also hand express a few drops of breast milk and gently massage that milk into your nipples and allow the milk to air-dry. In the first few weeks after giving birth, some women experience breast engorgement. Engorgement can make your breasts feel heavy, warm, and tender to the touch. Engorgement peaks within 3-5 days after you give birth. The following recommendations can help to ease engorgement:  Completely empty your breasts while breastfeeding or pumping. You may want to start by applying warm, moist heat (in the shower or with warm, water-soaked hand towels) just before feeding or pumping. This increases circulation and helps the milk flow. If your baby does not completely empty your breasts while breastfeeding, pump any extra milk after he or she is finished.  Apply ice packs to your breasts immediately after breastfeeding or pumping, unless this is too uncomfortable for you. To do this: ? Put ice in a plastic bag. ? Place a towel between your skin and the bag. ? Leave the ice on for 20 minutes, 2-3 times a day.  Make sure that your baby is latched on and positioned properly while breastfeeding. If engorgement persists after 48 hours of following these recommendations, contact your health care provider or a Advertising copywriter. Overall health care recommendations while breastfeeding  Eat 3  healthy meals and 3 snacks every day. Well-nourished mothers who are breastfeeding need an additional 450-500 calories a day. You can meet this requirement by increasing the amount of a balanced diet that you eat.  Drink enough water to keep your urine pale yellow or clear.  Rest often, relax, and continue to take your prenatal vitamins to prevent fatigue, stress, and low vitamin and mineral levels in your body (nutrient deficiencies).  Do not use any products that contain nicotine or tobacco, such as cigarettes and e-cigarettes. Your baby may be harmed by chemicals from cigarettes that pass into breast milk and exposure to secondhand smoke. If you need help quitting, ask your health care provider.  Avoid alcohol.  Do not use illegal drugs or marijuana.  Talk with your health care provider before taking any medicines. These include over-the-counter and prescription medicines as well as vitamins and herbal supplements. Some medicines that may be harmful to your baby can pass through breast milk.  It is possible to become pregnant while breastfeeding. If birth control is desired, ask your health care provider about options that will be safe while breastfeeding your baby. Where to find more information: [employer]: www.llli.org Contact a health care provider if:  You feel like you want to stop breastfeeding or have become frustrated with breastfeeding.  Your nipples are cracked or bleeding.  Your breasts are red, tender, or warm.  You have: ? Painful breasts or nipples. ? A swollen area on either breast. ? A fever or chills. ? Nausea or vomiting. ? Drainage other than breast milk from your nipples.  Your breasts do not become full before feedings by the fifth day after you give birth.  You feel sad and depressed.  Your baby is: ? Too sleepy to eat well. ? Having trouble sleeping. ? More than 441 week old and wetting fewer than 6 diapers in a 24-hour period. ? Not  gaining weight by 635 days of age.  Your baby has fewer than 3 stools in a 24-hour period.  Your baby's skin or the white parts of his or her eyes become yellow. Get help right away if:  Your baby is overly tired (lethargic) and does not want to wake up and feed.  Your baby develops an unexplained fever. Summary  Breastfeeding offers many health benefits for infant and mothers.  Try to breastfeed your infant when he or she shows early signs of hunger.  Gently tickle or stroke your baby's lips with your finger or nipple to allow the baby to open his or her mouth. Bring the baby to your breast. Make sure that much of the areola is in your baby's mouth. Offer one side and burp the baby before you offer the other side.  Talk with your health care provider or lactation consultant if you have questions or you face problems as you breastfeed. This information is not intended to replace advice given to you by your health care provider. Make sure you discuss any questions you have with your health care provider. Document Revised: 12/03/2017 Document Reviewed: 10/10/2016 Elsevier Patient Education  2020 [employer]

## 2020-05-30 ENCOUNTER — Ambulatory Visit (INDEPENDENT_AMBULATORY_CARE_PROVIDER_SITE_OTHER): Payer: Self-pay | Admitting: Certified Nurse Midwife

## 2020-05-30 ENCOUNTER — Other Ambulatory Visit: Payer: Self-pay

## 2020-05-30 ENCOUNTER — Encounter: Payer: Self-pay | Admitting: Certified Nurse Midwife

## 2020-05-30 VITALS — BP 120/79 | HR 99 | Wt 174.8 lb

## 2020-05-30 DIAGNOSIS — Z3A34 34 weeks gestation of pregnancy: Secondary | ICD-10-CM

## 2020-05-30 DIAGNOSIS — Z3403 Encounter for supervision of normal first pregnancy, third trimester: Secondary | ICD-10-CM

## 2020-05-30 LAB — POCT URINALYSIS DIPSTICK OB
Bilirubin, UA: NEGATIVE
Blood, UA: NEGATIVE
Glucose, UA: NEGATIVE
Ketones, UA: NEGATIVE
Leukocytes, UA: NEGATIVE
Nitrite, UA: NEGATIVE
POC,PROTEIN,UA: NEGATIVE
Spec Grav, UA: <=1.005 — AB (ref 1.010–1.025)
Urobilinogen, UA: 0.2 E.U./dL
pH, UA: 6 (ref 5.0–8.0)

## 2020-05-30 NOTE — Progress Notes (Signed)
ROB doing well. Feels good movment. Birth plan reviewed. Copy to chart. GBS and cultures discussed. Follow up 2 wks with Marcelino Duster.   Doreene Burke, CNM

## 2020-05-30 NOTE — Patient Instructions (Signed)
[facility]  [facility]  [address], [place], [place] [address]  Phone: [phone]  [facility] (second location)  [address] [place], [place] [address]  Phone: [phone]  [facility] ([place]) [address], [place], [place] [address] Phone: [phone]  [facility]  [address] [place], [place] [address]  Phone: [phone] 

## 2020-06-11 ENCOUNTER — Ambulatory Visit (INDEPENDENT_AMBULATORY_CARE_PROVIDER_SITE_OTHER): Payer: Self-pay | Admitting: Certified Nurse Midwife

## 2020-06-11 ENCOUNTER — Other Ambulatory Visit: Payer: Self-pay

## 2020-06-11 ENCOUNTER — Encounter: Payer: Self-pay | Admitting: Certified Nurse Midwife

## 2020-06-11 VITALS — BP 128/81 | HR 82 | Wt 177.0 lb

## 2020-06-11 DIAGNOSIS — Z3403 Encounter for supervision of normal first pregnancy, third trimester: Secondary | ICD-10-CM

## 2020-06-11 DIAGNOSIS — Z3A36 36 weeks gestation of pregnancy: Secondary | ICD-10-CM

## 2020-06-11 LAB — POCT URINALYSIS DIPSTICK OB
Bilirubin, UA: NEGATIVE
Blood, UA: NEGATIVE
Glucose, UA: NEGATIVE
Ketones, UA: NEGATIVE
Leukocytes, UA: NEGATIVE
Nitrite, UA: NEGATIVE
POC,PROTEIN,UA: NEGATIVE
Spec Grav, UA: 1.01 (ref 1.010–1.025)
Urobilinogen, UA: 0.2 E.U./dL
pH, UA: 6.5 (ref 5.0–8.0)

## 2020-06-11 NOTE — Patient Instructions (Addendum)
Fetal Movement Counts Patient Name: ________________________________________________ Patient Due Date: ____________________ What is a fetal movement count?  A fetal movement count is the number of times that you feel your baby move during a certain amount of time. This may also be called a fetal kick count. A fetal movement count is recommended for every pregnant woman. You may be asked to start counting fetal movements as early as week 28 of your pregnancy. Pay attention to when your baby is most active. You may notice your baby's sleep and wake cycles. You may also notice things that make your baby move more. You should do a fetal movement count:  When your baby is normally most active.  At the same time each day. A good time to count movements is while you are resting, after having something to eat and drink. How do I count fetal movements? 1. Find a quiet, comfortable area. Sit, or lie down on your side. 2. Write down the date, the start time and stop time, and the number of movements that you felt between those two times. Take this information with you to your health care visits. 3. Write down your start time when you feel the first movement. 4. Count kicks, flutters, swishes, rolls, and jabs. You should feel at least 10 movements. 5. You may stop counting after you have felt 10 movements, or if you have been counting for 2 hours. Write down the stop time. 6. If you do not feel 10 movements in 2 hours, contact your health care provider for further instructions. Your health care provider may want to do additional tests to assess your baby's well-being. Contact a health care provider if:  You feel fewer than 10 movements in 2 hours.  Your baby is not moving like he or she usually does. Date: ____________ Start time: ____________ Stop time: ____________ Movements: ____________ Date: ____________ Start time: ____________ Stop time: ____________ Movements: ____________ Date: ____________  Start time: ____________ Stop time: ____________ Movements: ____________ Date: ____________ Start time: ____________ Stop time: ____________ Movements: ____________ Date: ____________ Start time: ____________ Stop time: ____________ Movements: ____________ Date: ____________ Start time: ____________ Stop time: ____________ Movements: ____________ Date: ____________ Start time: ____________ Stop time: ____________ Movements: ____________ Date: ____________ Start time: ____________ Stop time: ____________ Movements: ____________ Date: ____________ Start time: ____________ Stop time: ____________ Movements: ____________ This information is not intended to replace advice given to you by your health care provider. Make sure you discuss any questions you have with your health care provider. Document Revised: 04/28/2019 Document Reviewed: 04/28/2019 Elsevier Patient Education  2020 [employer]  Group B Streptococcus Test During Pregnancy Why am I having this test? Routine testing, also called screening, for group B streptococcus (GBS) is recommended for all pregnant women between the 36th and 37th week of pregnancy. GBS is a type of bacteria that can be passed from mother to baby during childbirth. Screening will help guide whether or not you will need treatment during labor and delivery to prevent complications such as:  An infection in your uterus during labor.  An infection in your uterus after delivery.  A serious infection in your baby after delivery, such as pneumonia, meningitis, or sepsis. GBS screening is not often done before 36 weeks of pregnancy unless you go into labor prematurely. What happens if I have group B streptococcus? If testing shows that you have GBS, your health care provider will recommend treatment with IV antibiotics during labor and delivery. This treatment significantly decreases the risk of complications   for you and your baby. If you have a planned C-section and you  have GBS, you may not need to be treated with antibiotics because GBS is usually passed to babies after labor starts and your water breaks. If you are in labor or your water breaks before your C-section, it is possible for GBS to get into your uterus and be passed to your baby, so you might need treatment. Is there a chance I may not need to be tested? You may not need to be tested for GBS if:  You have a urine test that shows GBS before 36 to 37 weeks.  You had a baby with GBS infection after a previous delivery. In these cases, you will automatically be treated for GBS during labor and delivery. What is being tested? This test is done to check if you have group B streptococcus in your vagina or rectum. What kind of sample is taken? To collect samples for this test, your health care provider will swab your vagina and rectum with a cotton swab. The sample is then sent to the lab to see if GBS is present. What happens during the test?   You will remove your clothing from the waist down.  You will lie down on an exam table in the same position as you would for a pelvic exam.  Your health care provider will swab your vagina and rectum to collect samples for a culture test.  You will be able to go home after the test and do all your usual activities. How are the results reported? The test results are reported as positive or negative. What do the results mean?  A positive test means you are at risk for passing GBS to your baby during labor and delivery. Your health care provider will recommend that you are treated with an IV antibiotic during labor and delivery.  A negative test means you are at very low risk of passing GBS to your baby. There is still a low risk of passing GBS to your baby because sometimes test results may report that you do not have a condition when you do (false-negative result) or there is a chance that you may become infected with GBS after the test is done. You most  likely will not need to be treated with an antibiotic during labor and delivery. Talk with your health care provider about what your results mean. Questions to ask your health care provider Ask your health care provider, or the department that is doing the test:  When will my results be ready?  How will I get my results?  What are my treatment options? Summary  Routine testing (screening) for group B streptococcus (GBS) is recommended for all pregnant women between the 36th and 37th week of pregnancy.  GBS is a type of bacteria that can be passed from mother to baby during childbirth.  If testing shows that you have GBS, your health care provider will recommend that you are treated with IV antibiotics during labor and delivery. This treatment almost always prevents infection in newborns. This information is not intended to replace advice given to you by your health care provider. Make sure you discuss any questions you have with your health care provider. Document Revised: 12/30/2018 Document Reviewed: 10/06/2018 Elsevier Patient Education  2020 [employer].

## 2020-06-11 NOTE — Progress Notes (Signed)
ROB-Doing well, no questions or concerns. 36 week cultures collected today, see orders. Pre-labor checklist, herbal prep guide and birth affirmations given. Anticipatory guidance regarding course of prenatal care. Reviewed red flag symptoms and when to call. RTC x 1 week for ROB or sooner if needed.

## 2020-06-13 LAB — STREP GP B NAA+RFLX: Strep Gp B NAA+Rflx: NEGATIVE

## 2020-06-14 LAB — GC/CHLAMYDIA PROBE AMP
Chlamydia trachomatis, NAA: NEGATIVE
Neisseria Gonorrhoeae by PCR: NEGATIVE

## 2020-06-18 ENCOUNTER — Other Ambulatory Visit: Payer: Self-pay

## 2020-06-18 ENCOUNTER — Ambulatory Visit (INDEPENDENT_AMBULATORY_CARE_PROVIDER_SITE_OTHER): Payer: Self-pay | Admitting: Certified Nurse Midwife

## 2020-06-18 VITALS — BP 116/80 | HR 89 | Wt 179.2 lb

## 2020-06-18 DIAGNOSIS — Z3A37 37 weeks gestation of pregnancy: Secondary | ICD-10-CM

## 2020-06-18 LAB — POCT URINALYSIS DIPSTICK OB
Bilirubin, UA: NEGATIVE
Blood, UA: NEGATIVE
Glucose, UA: NEGATIVE
Ketones, UA: NEGATIVE
Leukocytes, UA: NEGATIVE
Nitrite, UA: NEGATIVE
POC,PROTEIN,UA: NEGATIVE
Spec Grav, UA: 1.02 (ref 1.010–1.025)
Urobilinogen, UA: 0.2 E.U./dL
pH, UA: 5 (ref 5.0–8.0)

## 2020-06-18 NOTE — Progress Notes (Signed)
ROB doing well. Feels good movement. Labor precautions reviewed. GBS results reviewed. Discussed vaginal exam at 38 or 39 wks if she would like to be checked. She verbalized and agreed. Follow up 1 wk with Marcelino Duster.   Doreene Burke, CNM

## 2020-06-29 ENCOUNTER — Other Ambulatory Visit: Payer: Self-pay

## 2020-06-29 ENCOUNTER — Ambulatory Visit (INDEPENDENT_AMBULATORY_CARE_PROVIDER_SITE_OTHER): Payer: Self-pay | Admitting: Certified Nurse Midwife

## 2020-06-29 ENCOUNTER — Encounter: Payer: Self-pay | Admitting: Certified Nurse Midwife

## 2020-06-29 VITALS — BP 113/77 | HR 79 | Wt 178.7 lb

## 2020-06-29 DIAGNOSIS — Z3A38 38 weeks gestation of pregnancy: Secondary | ICD-10-CM

## 2020-06-29 DIAGNOSIS — Z3403 Encounter for supervision of normal first pregnancy, third trimester: Secondary | ICD-10-CM

## 2020-06-29 LAB — POCT URINALYSIS DIPSTICK OB
Bilirubin, UA: NEGATIVE
Blood, UA: NEGATIVE
Glucose, UA: NEGATIVE
Ketones, UA: NEGATIVE
Nitrite, UA: NEGATIVE
POC,PROTEIN,UA: NEGATIVE
Spec Grav, UA: 1.01 (ref 1.010–1.025)
Urobilinogen, UA: 0.2 E.U./dL
pH, UA: 6 (ref 5.0–8.0)

## 2020-06-29 NOTE — Patient Instructions (Signed)
Vaginal Delivery  Vaginal delivery means that you give birth by pushing your baby out of your birth canal (vagina). A team of health care providers will help you before, during, and after vaginal delivery. Birth experiences are unique for every woman and every pregnancy, and birth experiences vary depending on where you choose to give birth. What happens when I arrive at the birth center or hospital? Once you are in labor and have been admitted into the hospital or birth center, your health care provider may:  Review your pregnancy history and any concerns that you have.  Insert an IV into one of your veins. This may be used to give you fluids and medicines.  Check your blood pressure, pulse, temperature, and heart rate (vital signs).  Check whether your bag of water (amniotic sac) has broken (ruptured).  Talk with you about your birth plan and discuss pain control options. Monitoring Your health care provider may monitor your contractions (uterine monitoring) and your baby's heart rate (fetal monitoring). You may need to be monitored:  Often, but not continuously (intermittently).  All the time or for long periods at a time (continuously). Continuous monitoring may be needed if: ? You are taking certain medicines, such as medicine to relieve pain or make your contractions stronger. ? You have pregnancy or labor complications. Monitoring may be done by:  Placing a special stethoscope or a handheld monitoring device on your abdomen to check your baby's heartbeat and to check for contractions.  Placing monitors on your abdomen (external monitors) to record your baby's heartbeat and the frequency and length of contractions.  Placing monitors inside your uterus through your vagina (internal monitors) to record your baby's heartbeat and the frequency, length, and strength of your contractions. Depending on the type of monitor, it may remain in your uterus or on your baby's head until  birth.  Telemetry. This is a type of continuous monitoring that can be done with external or internal monitors. Instead of having to stay in bed, you are able to move around during telemetry. Physical exam Your health care provider may perform frequent physical exams. This may include:  Checking how and where your baby is positioned in your uterus.  Checking your cervix to determine: ? Whether it is thinning out (effacing). ? Whether it is opening up (dilating). What happens during labor and delivery?  Normal labor and delivery is divided into the following three stages: Stage 1  This is the longest stage of labor.  This stage can last for hours or days.  Throughout this stage, you will feel contractions. Contractions generally feel mild, infrequent, and irregular at first. They get stronger, more frequent (about every 2-3 minutes), and more regular as you move through this stage.  This stage ends when your cervix is completely dilated to 4 inches (10 cm) and completely effaced. Stage 2  This stage starts once your cervix is completely effaced and dilated and lasts until the delivery of your baby.  This stage may last from 20 minutes to 2 hours.  This is the stage where you will feel an urge to push your baby out of your vagina.  You may feel stretching and burning pain, especially when the widest part of your baby's head passes through the vaginal opening (crowning).  Once your baby is delivered, the umbilical cord will be clamped and cut. This usually occurs after waiting a period of 1-2 minutes after delivery.  Your baby will be placed on your bare chest (  skin-to-skin contact) in an upright position and covered with a warm blanket. Watch your baby for feeding cues, like rooting or sucking, and help the baby to your breast for his or her first feeding. Stage 3  This stage starts immediately after the birth of your baby and ends after you deliver the placenta.  This stage may  take anywhere from 5 to 30 minutes.  After your baby has been delivered, you will feel contractions as your body expels the placenta and your uterus contracts to control bleeding. What can I expect after labor and delivery?  After labor is over, you and your baby will be monitored closely until you are ready to go home to ensure that you are both healthy. Your health care team will teach you how to care for yourself and your baby.  You and your baby will stay in the same room (rooming in) during your hospital stay. This will encourage early bonding and successful breastfeeding.  You may continue to receive fluids and medicines through an IV.  Your uterus will be checked and massaged regularly (fundal massage).  You will have some soreness and pain in your abdomen, vagina, and the area of skin between your vaginal opening and your anus (perineum).  If an incision was made near your vagina (episiotomy) or if you had some vaginal tearing during delivery, cold compresses may be placed on your episiotomy or your tear. This helps to reduce pain and swelling.  You may be given a squirt bottle to use instead of wiping when you go to the bathroom. To use the squirt bottle, follow these steps: ? Before you urinate, fill the squirt bottle with warm water. Do not use hot water. ? After you urinate, while you are sitting on the toilet, use the squirt bottle to rinse the area around your urethra and vaginal opening. This rinses away any urine and blood. ? Fill the squirt bottle with clean water every time you use the bathroom.  It is normal to have vaginal bleeding after delivery. Wear a sanitary pad for vaginal bleeding and discharge. Summary  Vaginal delivery means that you will give birth by pushing your baby out of your birth canal (vagina).  Your health care provider may monitor your contractions (uterine monitoring) and your baby's heart rate (fetal monitoring).  Your health care provider may  perform a physical exam.  Normal labor and delivery is divided into three stages.  After labor is over, you and your baby will be monitored closely until you are ready to go home. This information is not intended to replace advice given to you by your health care provider. Make sure you discuss any questions you have with your health care provider. Document Revised: 10/13/2017 Document Reviewed: 10/13/2017 Elsevier Patient Education  2020 Else[employer] Inc.    Fetal Movement Counts Patient Name: ________________________________________________ Patient Due Date: ____________________ What is a fetal movement count?  A fetal movement count is the number of times that you feel your baby move during a certain amount of time. This may also be called a fetal kick count. A fetal movement count is recommended for every pregnant woman. You may be asked to start counting fetal movements as early as week 28 of your pregnancy. Pay attention to when your baby is most active. You may notice your baby's sleep and wake cycles. You may also notice things that make your baby move more. You should do a fetal movement count:  When your baby is normally most   active.  At the same time each day. A good time to count movements is while you are resting, after having something to eat and drink. How do I count fetal movements? 1. Find a quiet, comfortable area. Sit, or lie down on your side. 2. Write down the date, the start time and stop time, and the number of movements that you felt between those two times. Take this information with you to your health care visits. 3. Write down your start time when you feel the first movement. 4. Count kicks, flutters, swishes, rolls, and jabs. You should feel at least 10 movements. 5. You may stop counting after you have felt 10 movements, or if you have been counting for 2 hours. Write down the stop time. 6. If you do not feel 10 movements in 2 hours, contact your health care  provider for further instructions. Your health care provider may want to do additional tests to assess your baby's well-being. Contact a health care provider if:  You feel fewer than 10 movements in 2 hours.  Your baby is not moving like he or she usually does. Date: ____________ Start time: ____________ Stop time: ____________ Movements: ____________ Date: ____________ Start time: ____________ Stop time: ____________ Movements: ____________ Date: ____________ Start time: ____________ Stop time: ____________ Movements: ____________ Date: ____________ Start time: ____________ Stop time: ____________ Movements: ____________ Date: ____________ Start time: ____________ Stop time: ____________ Movements: ____________ Date: ____________ Start time: ____________ Stop time: ____________ Movements: ____________ Date: ____________ Start time: ____________ Stop time: ____________ Movements: ____________ Date: ____________ Start time: ____________ Stop time: ____________ Movements: ____________ Date: ____________ Start time: ____________ Stop time: ____________ Movements: ____________ This information is not intended to replace advice given to you by your health care provider. Make sure you discuss any questions you have with your health care provider. Document Revised: 04/28/2019 Document Reviewed: 04/28/2019 Elsevier Patient Education  2020 [employer] Inc.  

## 2020-07-01 NOTE — Progress Notes (Signed)
ROB-Doing well, no questions or concerns. Drinking labor prep tea, discussed other home treatment measures. Anticipatory guidance regarding course of prenatal care. Reviewed red flag symptoms and when to call. RTC x 1 week for ROB or sooner if needed.

## 2020-07-04 ENCOUNTER — Ambulatory Visit (INDEPENDENT_AMBULATORY_CARE_PROVIDER_SITE_OTHER): Payer: Self-pay | Admitting: Certified Nurse Midwife

## 2020-07-04 ENCOUNTER — Other Ambulatory Visit: Payer: Self-pay

## 2020-07-04 VITALS — BP 128/84 | HR 90 | Wt 179.5 lb

## 2020-07-04 DIAGNOSIS — Z3403 Encounter for supervision of normal first pregnancy, third trimester: Secondary | ICD-10-CM

## 2020-07-04 LAB — POCT URINALYSIS DIPSTICK OB
Bilirubin, UA: NEGATIVE
Blood, UA: NEGATIVE
Glucose, UA: NEGATIVE
Ketones, UA: NEGATIVE
Leukocytes, UA: NEGATIVE
Nitrite, UA: NEGATIVE
POC,PROTEIN,UA: NEGATIVE
Spec Grav, UA: 1.015 (ref 1.010–1.025)
Urobilinogen, UA: 0.2 E.U./dL
pH, UA: 5 (ref 5.0–8.0)

## 2020-07-04 NOTE — Patient Instructions (Signed)
Braxton Hicks Contractions °Contractions of the uterus can occur throughout pregnancy, but they are not always a sign that you are in labor. You may have practice contractions called Braxton Hicks contractions. These false labor contractions are sometimes confused with true labor. °What are Braxton Hicks contractions? °Braxton Hicks contractions are tightening movements that occur in the muscles of the uterus before labor. Unlike true labor contractions, these contractions do not result in opening (dilation) and thinning of the cervix. Toward the end of pregnancy (32-34 weeks), Braxton Hicks contractions can happen more often and may become stronger. These contractions are sometimes difficult to tell apart from true labor because they can be very uncomfortable. You should not feel embarrassed if you go to the hospital with false labor. °Sometimes, the only way to tell if you are in true labor is for your health care provider to look for changes in the cervix. The health care provider will do a physical exam and may monitor your contractions. If you are not in true labor, the exam should show that your cervix is not dilating and your water has not broken. °If there are no other health problems associated with your pregnancy, it is completely safe for you to be sent home with false labor. You may continue to have Braxton Hicks contractions until you go into true labor. °How to tell the difference between true labor and false labor °True labor °· Contractions last 30-70 seconds. °· Contractions become very regular. °· Discomfort is usually felt in the top of the uterus, and it spreads to the lower abdomen and low back. °· Contractions do not go away with walking. °· Contractions usually become more intense and increase in frequency. °· The cervix dilates and gets thinner. °False labor °· Contractions are usually shorter and not as strong as true labor contractions. °· Contractions are usually irregular. °· Contractions  are often felt in the front of the lower abdomen and in the groin. °· Contractions may go away when you walk around or change positions while lying down. °· Contractions get weaker and are shorter-lasting as time goes on. °· The cervix usually does not dilate or become thin. °Follow these instructions at home: ° °· Take over-the-counter and prescription medicines only as told by your health care provider. °· Keep up with your usual exercises and follow other instructions from your health care provider. °· Eat and drink lightly if you think you are going into labor. °· If Braxton Hicks contractions are making you uncomfortable: °? Change your position from lying down or resting to walking, or change from walking to resting. °? Sit and rest in a tub of warm water. °? Drink enough fluid to keep your urine pale yellow. Dehydration may cause these contractions. °? Do slow and deep breathing several times an hour. °· Keep all follow-up prenatal visits as told by your health care provider. This is important. °Contact a health care provider if: °· You have a fever. °· You have continuous pain in your abdomen. °Get help right away if: °· Your contractions become stronger, more regular, and closer together. °· You have fluid leaking or gushing from your vagina. °· You pass blood-tinged mucus (bloody show). °· You have bleeding from your vagina. °· You have low back pain that you never had before. °· You feel your baby’s head pushing down and causing pelvic pressure. °· Your baby is not moving inside you as much as it used to. °Summary °· Contractions that occur before labor are   called Braxton Hicks contractions, false labor, or practice contractions. °· Braxton Hicks contractions are usually shorter, weaker, farther apart, and less regular than true labor contractions. True labor contractions usually become progressively stronger and regular, and they become more frequent. °· Manage discomfort from Braxton Hicks contractions  by changing position, resting in a warm bath, drinking plenty of water, or practicing deep breathing. °This information is not intended to replace advice given to you by your health care provider. Make sure you discuss any questions you have with your health care provider. °Document Revised: 08/21/2017 Document Reviewed: 01/22/2017 °Elsevier Patient Education © 2020 [employer] ° °

## 2020-07-04 NOTE — Progress Notes (Signed)
ROB doing well. Feels good movement. Reviewed labor precautions. Discussed u/s next visit and induction at 41 wks. Pt verbalizes understanding. Follow up Tuesday for u/s and ROB.   Doreene Burke, CNM

## 2020-07-08 ENCOUNTER — Inpatient Hospital Stay: Admit: 2020-07-08 | Payer: Self-pay

## 2020-07-10 ENCOUNTER — Ambulatory Visit (INDEPENDENT_AMBULATORY_CARE_PROVIDER_SITE_OTHER): Payer: Self-pay | Admitting: Certified Nurse Midwife

## 2020-07-10 ENCOUNTER — Ambulatory Visit (INDEPENDENT_AMBULATORY_CARE_PROVIDER_SITE_OTHER): Payer: Self-pay

## 2020-07-10 ENCOUNTER — Other Ambulatory Visit: Payer: Self-pay

## 2020-07-10 VITALS — BP 134/88 | HR 86 | Wt 181.3 lb

## 2020-07-10 DIAGNOSIS — Z3A40 40 weeks gestation of pregnancy: Secondary | ICD-10-CM

## 2020-07-10 DIAGNOSIS — Z3403 Encounter for supervision of normal first pregnancy, third trimester: Secondary | ICD-10-CM

## 2020-07-10 LAB — POCT URINALYSIS DIPSTICK OB
Bilirubin, UA: NEGATIVE
Blood, UA: NEGATIVE
Glucose, UA: NEGATIVE
Ketones, UA: NEGATIVE
Leukocytes, UA: NEGATIVE
Nitrite, UA: NEGATIVE
POC,PROTEIN,UA: NEGATIVE
Spec Grav, UA: 1.01 (ref 1.010–1.025)
Urobilinogen, UA: 0.2 E.U./dL
pH, UA: 5 (ref 5.0–8.0)

## 2020-07-10 NOTE — Progress Notes (Signed)
ROB doing well, Feels good movement. U/s today for post dates, reviewed with pt. Discussed recommendation for induction at 41 wks. Pt declines , stating she would like labor to occur naturally. Discussed aging placenta and potential risk. She verbalizes understanding. She is agreeable to NST and repeat BPP/ultrasound at 41 wks. She declines SVE today. BP elevated repeat WNL, no swelling , denies visual changes, headaches, epigastric pain.  Follow up this Friday for NST with [facility].   Doreene Burke, CNM   Patient Name: Sally Hall DOB: 1991-05-10 MRN: [id number] ULTRASOUND REPORT  Location: [facility]  Date of Service: 07/10/2020   Indications:growth/afi Findings:  Sally Hall intrauterine pregnancy is visualized with FHR at 127 BPM. Fetal presentation is Cephalic.  Placenta: posterior. Grade: 2 AFI: 12.6 cm  Growth percentile is 48. EFW: 3466 g ( 7 lbs 10 oz)   BPP Scoring: Movement: 2/2  Tone: 2/2  Breathing: 2/2  AFI: 2/2   Impression: 1. [redacted]w[redacted]d Viable Singleton Intrauterine pregnancy previously established criteria. 2. Growth is 48 %ile.  AFI is 12.6 cm.   Recommendations: 1.Clinical correlation with the patient's History and Physical Exam.   Jenine  M. Marciano Sequin     RDMS

## 2020-07-10 NOTE — Patient Instructions (Signed)
Nonstress Test A nonstress test is a procedure that is done during pregnancy in order to check the baby's heartbeat. The procedure can help show if the baby (fetus) is healthy. It is commonly done when:  The baby is past his or her due date.  The pregnancy is high risk.  The baby is moving less than normal.  The mother has lost a pregnancy in the past.  The health care provider suspects a problem with the baby's growth.  There is too much or too little amniotic fluid. The procedure is often done in the third trimester of pregnancy to find out if an early delivery is needed and whether such a delivery is safe. During a nonstress test, the baby's heartbeat is monitored when the baby is resting and when the baby is moving. If the baby is healthy, the heart rate will increase when he or she moves or kicks and will return to normal when he or she rests. Tell a health care provider about:  Any allergies you have.  Any medical conditions you have.  All medicines you are taking, including vitamins, herbs, eye drops, creams, and over-the-counter medicines. What are the risks? There are no risks to you or your baby from a nonstress test. This procedure should not be painful or uncomfortable. What happens before the procedure?  Eat a meal right before the test or as directed by your health care provider. Food may help encourage the baby to move.  Use the restroom right before the test. What happens during the procedure?  Two monitors will be placed on your abdomen. One will record the baby's heart rate and the other will record the contractions of your uterus.  You may be asked to lie down on your side or to sit upright.  You may be given a button to press when you feel your baby move.  Your health care provider will listen to your baby's heartbeat and recorded it. He or she may also watch your baby's heartbeat on a screen.  If the baby seems to be sleeping, you may be asked to drink  some juice or soda, eat a snack, or change positions. The procedure may vary among health care providers and hospitals. What happens after the procedure?  Your health care provider will discuss the test results with you and make recommendations for the future. Depending on the results, your health care provider may order additional tests or another course of action.  If your health care provider gave you any diet or activity instructions, make sure to follow them.  Keep all follow-up visits as told by your health care provider. This is important. Summary  A nonstress test is a procedure that is done during pregnancy in order to check the baby's heartbeat. The procedure can help show if the baby is healthy.  The procedure is often done in the third trimester of pregnancy to find out if an early delivery is needed and whether such a delivery is safe.  During a nonstress test, the baby's heartbeat is monitored when the baby is resting and when the baby is moving. If the baby is healthy, the heart rate will increase when he or she moves or kicks and will return to normal when he or she rests.  Your health care provider will discuss the test results with you and make recommendations for the future. This information is not intended to replace advice given to you by your health care provider. Make sure you discuss any   questions you have with your health care provider. Document Revised: 12/18/2016 Document Reviewed: 12/18/2016 Elsevier Patient Education  2020 Elsevier Inc.  

## 2020-07-13 ENCOUNTER — Encounter: Payer: Self-pay | Admitting: Certified Nurse Midwife

## 2020-07-13 ENCOUNTER — Other Ambulatory Visit: Payer: Self-pay

## 2020-07-13 ENCOUNTER — Ambulatory Visit (INDEPENDENT_AMBULATORY_CARE_PROVIDER_SITE_OTHER): Payer: Self-pay | Admitting: Certified Nurse Midwife

## 2020-07-13 VITALS — BP 127/86 | HR 85 | Wt 181.1 lb

## 2020-07-13 DIAGNOSIS — Z3403 Encounter for supervision of normal first pregnancy, third trimester: Secondary | ICD-10-CM

## 2020-07-13 DIAGNOSIS — Z3A40 40 weeks gestation of pregnancy: Secondary | ICD-10-CM

## 2020-07-13 LAB — POCT URINALYSIS DIPSTICK OB
Bilirubin, UA: NEGATIVE
Blood, UA: NEGATIVE
Glucose, UA: NEGATIVE
Ketones, UA: NEGATIVE
Nitrite, UA: NEGATIVE
POC,PROTEIN,UA: NEGATIVE
Spec Grav, UA: 1.01 (ref 1.010–1.025)
Urobilinogen, UA: 0.2 E.U./dL
pH, UA: 6.5 (ref 5.0–8.0)

## 2020-07-13 NOTE — Patient Instructions (Signed)
Fetal Movement Counts Patient Name: ________________________________________________ Patient Due Date: ____________________ What is a fetal movement count?  A fetal movement count is the number of times that you feel your baby move during a certain amount of time. This may also be called a fetal kick count. A fetal movement count is recommended for every pregnant woman. You may be asked to start counting fetal movements as early as week 28 of your pregnancy. Pay attention to when your baby is most active. You may notice your baby's sleep and wake cycles. You may also notice things that make your baby move more. You should do a fetal movement count:  When your baby is normally most active.  At the same time each day. A good time to count movements is while you are resting, after having something to eat and drink. How do I count fetal movements? 1. Find a quiet, comfortable area. Sit, or lie down on your side. 2. Write down the date, the start time and stop time, and the number of movements that you felt between those two times. Take this information with you to your health care visits. 3. Write down your start time when you feel the first movement. 4. Count kicks, flutters, swishes, rolls, and jabs. You should feel at least 10 movements. 5. You may stop counting after you have felt 10 movements, or if you have been counting for 2 hours. Write down the stop time. 6. If you do not feel 10 movements in 2 hours, contact your health care provider for further instructions. Your health care provider may want to do additional tests to assess your baby's well-being. Contact a health care provider if:  You feel fewer than 10 movements in 2 hours.  Your baby is not moving like he or she usually does. Date: ____________ Start time: ____________ Stop time: ____________ Movements: ____________ Date: ____________ Start time: ____________ Stop time: ____________ Movements: ____________ Date: ____________  Start time: ____________ Stop time: ____________ Movements: ____________ Date: ____________ Start time: ____________ Stop time: ____________ Movements: ____________ Date: ____________ Start time: ____________ Stop time: ____________ Movements: ____________ Date: ____________ Start time: ____________ Stop time: ____________ Movements: ____________ Date: ____________ Start time: ____________ Stop time: ____________ Movements: ____________ Date: ____________ Start time: ____________ Stop time: ____________ Movements: ____________ Date: ____________ Start time: ____________ Stop time: ____________ Movements: ____________ This information is not intended to replace advice given to you by your health care provider. Make sure you discuss any questions you have with your health care provider. Document Revised: 04/28/2019 Document Reviewed: 04/28/2019 Elsevier Patient Education  2020 Elsevier Inc.  

## 2020-07-13 NOTE — Progress Notes (Signed)
ROB-Doing well. NST today reactive, see chart. Encouraged twice daily kick counts. [employer] given. Discussed home labor preparation techniques. Reviewed red flag symptoms and when to call. RTC as previously scheduled or sooner if needed.

## 2020-07-15 ENCOUNTER — Other Ambulatory Visit: Payer: Self-pay

## 2020-07-15 ENCOUNTER — Inpatient Hospital Stay
Admission: EM | Admit: 2020-07-15 | Discharge: 2020-07-17 | DRG: 805 | Disposition: A | Discharge status: Home / Self Care | Payer: PRIVATE HEALTH INSURANCE | Attending: Certified Nurse Midwife | Admitting: Certified Nurse Midwife

## 2020-07-15 ENCOUNTER — Encounter: Payer: Self-pay | Admitting: Obstetrics and Gynecology

## 2020-07-15 DIAGNOSIS — Z20822 Contact with and (suspected) exposure to covid-19: Secondary | ICD-10-CM | POA: Diagnosis present

## 2020-07-15 DIAGNOSIS — Z3A41 41 weeks gestation of pregnancy: Secondary | ICD-10-CM

## 2020-07-15 DIAGNOSIS — O480 Post-term pregnancy: Principal | ICD-10-CM | POA: Diagnosis present

## 2020-07-15 DIAGNOSIS — Z3A39 39 weeks gestation of pregnancy: Secondary | ICD-10-CM

## 2020-07-15 DIAGNOSIS — Q256 Stenosis of pulmonary artery: Secondary | ICD-10-CM | POA: Diagnosis not present

## 2020-07-15 DIAGNOSIS — O99892 Other specified diseases and conditions complicating childbirth: Secondary | ICD-10-CM | POA: Diagnosis present

## 2020-07-15 DIAGNOSIS — I370 Nonrheumatic pulmonary valve stenosis: Secondary | ICD-10-CM | POA: Diagnosis present

## 2020-07-15 LAB — CBC
HCT: 35.1 % — ABNORMAL LOW (ref 36.0–46.0)
Hemoglobin: 12 g/dL (ref 12.0–15.0)
MCH: 29.1 pg (ref 26.0–34.0)
MCHC: 34.2 g/dL (ref 30.0–36.0)
MCV: 85 fL (ref 80.0–100.0)
Platelets: 232 10*3/uL (ref 150–400)
RBC: 4.13 MIL/uL (ref 3.87–5.11)
RDW: 13.2 % (ref 11.5–15.5)
WBC: 19.1 10*3/uL — ABNORMAL HIGH (ref 4.0–10.5)
nRBC: 0 % (ref 0.0–0.2)

## 2020-07-15 LAB — TYPE AND SCREEN
ABO/RH(D): AB POS
Antibody Screen: NEGATIVE

## 2020-07-15 LAB — RPR: RPR Ser Ql: NONREACTIVE

## 2020-07-15 LAB — RESPIRATORY PANEL BY RT PCR (FLU A&B, COVID)
Influenza A by PCR: NEGATIVE
Influenza B by PCR: NEGATIVE
SARS Coronavirus 2 by RT PCR: NEGATIVE

## 2020-07-15 LAB — ABO/RH: ABO/RH(D): AB POS

## 2020-07-15 MED ORDER — OXYCODONE-ACETAMINOPHEN 5-325 MG PO TABS: 1.0000 | ORAL_TABLET | ORAL | Status: DC | PRN

## 2020-07-15 MED ORDER — OXYTOCIN BOLUS FROM INFUSION: 333.0000 mL | Freq: Once | INTRAVENOUS | Status: DC

## 2020-07-15 MED ORDER — MISOPROSTOL 200 MCG PO TABS
ORAL_TABLET | ORAL | Status: AC
  Filled 2020-07-15: qty 4

## 2020-07-15 MED ORDER — OXYTOCIN 10 UNIT/ML IJ SOLN
INTRAMUSCULAR | Status: AC
  Filled 2020-07-15: qty 2

## 2020-07-15 MED ORDER — LIDOCAINE HCL (PF) 1 % IJ SOLN
INTRAMUSCULAR | Status: AC
  Filled 2020-07-15: qty 30

## 2020-07-15 MED ORDER — AMMONIA AROMATIC IN INHA
RESPIRATORY_TRACT | Status: AC
  Filled 2020-07-15: qty 10

## 2020-07-15 MED ORDER — SOD CITRATE-CITRIC ACID 500-334 MG/5ML PO SOLN: 30.0000 mL | ORAL | Status: DC | PRN

## 2020-07-15 MED ORDER — LIDOCAINE HCL (PF) 1 % IJ SOLN
30.0000 mL | INTRAMUSCULAR | Status: AC | PRN
  Administered 2020-07-15: 30 mL via SUBCUTANEOUS

## 2020-07-15 MED ORDER — OXYTOCIN-SODIUM CHLORIDE 30-0.9 UT/500ML-% IV SOLN: 2.5000 [IU]/h | INTRAVENOUS | Status: DC

## 2020-07-15 MED ORDER — OXYTOCIN 10 UNIT/ML IJ SOLN
10.0000 [IU] | Freq: Once | INTRAMUSCULAR | Status: AC
  Administered 2020-07-15: 10 [IU] via INTRAMUSCULAR

## 2020-07-15 MED ORDER — OXYCODONE-ACETAMINOPHEN 5-325 MG PO TABS: 2.0000 | ORAL_TABLET | ORAL | Status: DC | PRN

## 2020-07-15 MED ORDER — ACETAMINOPHEN 325 MG PO TABS: 650.0000 mg | ORAL_TABLET | ORAL | Status: DC | PRN

## 2020-07-15 MED ORDER — LACTATED RINGERS IV SOLN: 500.0000 mL | INTRAVENOUS | Status: DC | PRN

## 2020-07-15 MED ORDER — BUTORPHANOL TARTRATE 1 MG/ML IJ SOLN: 1.0000 mg | INTRAMUSCULAR | Status: DC | PRN

## 2020-07-15 MED ORDER — ONDANSETRON HCL 4 MG/2ML IJ SOLN: 4.0000 mg | Freq: Four times a day (QID) | INTRAMUSCULAR | Status: DC | PRN

## 2020-07-15 NOTE — Progress Notes (Signed)
Patient ID: Sally Hall, female   DOB: 05-Oct-1990, 29 y.o.   MRN: [id number]  Sally Hall is a 29 y.o. G1P0 at [redacted]w[redacted]d by ultrasound admitted for active labor  Subjective:  Patient reports intermittent pelvic pressure and back pain with contractions. FOB at bedside for continuous labor support.   Denies difficulty breathing or respiratory distress, chest pain, dysuria, and leg pain or swelling.   Objective:  Temp:  [97.8 F (36.6 C)-98.4 F (36.9 C)] 97.8 F (36.6 C) (10/24 1150) Pulse Rate:  [74-94] 94 (10/24 1150) Resp:  [16-19] 19 (10/24 1150) BP: (128-142)/(79-90) 128/83 (10/24 1150) Weight:  [82.1 kg] 82.1 kg (10/24 0226)  Fetal Wellbeing:  Category I  UC:   regular, every four (4) to five (5) minutes; soft resting tone  SVE:   Dilation: 8 Effacement (%): 100 Station: 0 Exam by:: Willodean Rosenthal, CNM  Labs: Lab Results  Component Value Date   WBC 19.1 (H) 07/15/2020   HGB 12.0 07/15/2020   HCT 35.1 (L) 07/15/2020   MCV 85.0 07/15/2020   PLT 232 07/15/2020    Assessment:  Sally Hall a 29 y.o.G1P0 at [redacted]w[redacted]d admitted for labor, Rh positive, GBS negative  FHR Category I  Plan:  Side lying release completed on both sides, then patient placed in flying cowgirl position.   Reviewed red flag symptoms and when to call.   Continue orders as written. Reassess as needed.    Serafina Royals, CNM [facility], [facility] 07/15/2020, 2:55 PM

## 2020-07-15 NOTE — Progress Notes (Addendum)
Patient ID: Sally Hall, female   DOB: 1991-03-05, 29 y.o.   MRN: [id number]  Patient pushing for the last two (2) hours.   Recommended options for augmentation in labor including epidural, pitocin, vacuum assisted vaginal delivery and cesarean section.   Patient and spouse verbalized understanding and wish to continue pushing; decline all interventions at this time.   FHR Category I, will continue to monitor. Encouraged position change and use of peanut ball.   Reviewed red flag symptoms and when to call.   Update given to Dr. Logan Bores.    Serafina Royals, CNM [facility], [facility] 07/15/20 10:12 PM

## 2020-07-15 NOTE — Progress Notes (Signed)
Patient ID: Sally Hall, female   DOB: 06-07-1991, 29 y.o.   MRN: [id number]  Sally Hall is a 29 y.o. G1P0 at [redacted]w[redacted]d by ultrasound admitted for active labor  Subjective:  Patient sitting on edge of bed, breathing through contractions. FOB at bedside for continuous labor support.   Denies difficulty breathing or respiratory distress, chest pain, vaginal bleeding, and leg pain or swelling.   Objective:  Temp:  [98.4 F (36.9 C)] 98.4 F (36.9 C) (10/24 0747) Pulse Rate:  [74-90] 90 (10/24 0747) Resp:  [16-18] 18 (10/24 0747) BP: (130-142)/(79-90) 135/82 (10/24 0747) Weight:  [82.1 kg] 82.1 kg (10/24 0226)  Fetal Wellbeing:  Category I  UC:   irregular, every three (3) to four (4) minutes; soft resting tone  SVE:   Dilation: 7 Effacement (%): 90 Station: 0 Exam by:: Sally Hall, CNM  Labs: Lab Results  Component Value Date   WBC 19.1 (H) 07/15/2020   HGB 12.0 07/15/2020   HCT 35.1 (L) 07/15/2020   MCV 85.0 07/15/2020   PLT 232 07/15/2020    Assessment:  Sally Hall is a 29 y.o. G1P0 at [redacted]w[redacted]d admitted for labor, Rh positive, GBS negative  FHR Category I  Plan:  Labor augmentation options discussed with patient and spouse, declined at this time.   Encouraged position change and use of other non-pharmacologic pain management techniques.   Reviewed red flag symptoms and when to call.   Continue orders as written. Reassess as needed.    Sally Hall, CNM [facility], [facility] 07/15/2020, 10:16 AM

## 2020-07-15 NOTE — H&P (Signed)
Obstetric History and Physical  Daylee Delahoz is a 29 y.o. G1P0 with IUP at [redacted]w[redacted]d presenting with contractions since 1930.   Patient states she has been having  irregular, every few minutes contractions, none vaginal bleeding, intact membranes, with active fetal movement.    Denies difficulty breathing or respiratory distress, chest pain, dysuria, and leg pain or swelling.   Prenatal Course  Source of Care: [facility]-initial visit at 12 weeks, total visits:  14  Pregnancy complications or risks: Pulmonary stenosis  Prenatal labs and studies:  ABO, Rh: AB/Positive/-- (04/09 1638)  Antibody: Negative (04/09 1638)  Rubella: 1.03 (04/09 1638)  Varicella: 413 (04/09 1638)  RPR: Non Reactive (08/02 1438)   HBsAg: Negative (04/09 1638)   HIV: Non Reactive (04/09 1638)   GBS:--/Negative (09/20 1700)  1 hr Glucola: 80 (08/02 1438)  Genetic screening: Declined  Anatomy US: Complete, normal (06/13 1146)  Past Medical History:  Diagnosis Date  . Pulmonary stenosis     Past Surgical History:  Procedure Laterality Date  . NO PAST SURGERIES      OB History  Gravida Para Term Preterm AB Living  1            SAB TAB Ectopic Multiple Live Births               # Outcome Date GA Lbr Len/2nd Weight Sex Delivery Anes PTL Lv  1 Current             Social History   Socioeconomic History  . Marital status: Married    Spouse name: Not on file  . Number of children: Not on file  . Years of education: Not on file  . Highest education level: Not on file  Occupational History  . Not on file  Tobacco Use  . Smoking status: Never Smoker  . Smokeless tobacco: Never Used  Vaping Use  . Vaping Use: Never used  Substance and Sexual Activity  . Alcohol use: No  . Drug use: No  . Sexual activity: Yes    Birth control/protection: None    Comment: undecided  Other Topics Concern  . Not on file  Social History Narrative  . Not on file   Social Determinants of Health    Financial Resource Strain:   . Difficulty of Paying Living Expenses: Not on file  Food Insecurity:   . Worried About Programme researcher, broadcasting/film/video in the Last Year: Not on file  . Ran Out of Food in the Last Year: Not on file  Transportation Needs:   . Lack of Transportation (Medical): Not on file  . Lack of Transportation (Non-Medical): Not on file  Physical Activity:   . Days of Exercise per Week: Not on file  . Minutes of Exercise per Session: Not on file  Stress:   . Feeling of Stress : Not on file  Social Connections:   . Frequency of Communication with Friends and Family: Not on file  . Frequency of Social Gatherings with Friends and Family: Not on file  . Attends Religious Services: Not on file  . Active Member of Clubs or Organizations: Not on file  . Attends Banker Meetings: Not on file  . Marital Status: Not on file    Family History  Problem Relation Age of Onset  . Diabetes Maternal Grandfather     Medications Prior to Admission  Medication Sig Dispense Refill Last Dose  . Prenatal Vit-Fe Fumarate-FA (PRENATAL MULTIVITAMIN) TABS tablet Take 1 tablet by  mouth daily at 12 noon.   Past Week at Unknown time    Allergies  Allergen Reactions  . Penicillins     Review of Systems: Negative except for what is mentioned in HPI.  Physical Exam:  Temp:  [98.4 F (36.9 C)] 98.4 F (36.9 C) (10/24 0226) Pulse Rate:  [74-81] 81 (10/24 0409) Resp:  [16] 16 (10/24 0226) BP: (130-142)/(79-90) 130/79 (10/24 0409) Weight:  [82.1 kg] 82.1 kg (10/24 0226)  GENERAL: Well-developed, well-nourished female in no acute distress.   LUNGS: Clear to auscultation bilaterally.   HEART: Regular rate and rhythm.  ABDOMEN: Soft, nontender, nondistended, gravid.  EXTREMITIES: Nontender, no edema, 2+ distal pulses.  Cervical Exam: Dilation: 4.5 Effacement (%): 90 Station: -2 Presentation: Vertex Exam by:: B Darnell RN  FHR Category I  Contractions: Every two (2) to  five (5) minutes, soft resting tone   Pertinent Labs/Studies:    No results found for this or any previous visit (from the past 24 hour(s)).  Assessment :  Myeesha Shane is a 29 y.o. G1P0 at [redacted]w[redacted]d being admitted for labor, Rh positive, GBS negative  FHR Category I  Plan:  Admit to birthing suites, see orders.   Labor: Expectant management.  Induction/Augmentation as needed, per protocol.  Delivery plan: Hopeful for vaginal birth.   Dr. Logan Bores notified of admission and plan of care.    Gunnar Bulla, CNM [facility], [facility] 07/15/20 6:11 AM

## 2020-07-15 NOTE — OB Triage Note (Signed)
Pt is a 29y/o G1P0 at [redacted]w[redacted]d with c/o contractions that began around 730PM yesterday and got more intense at midnight. Pt states +FM. Pt denies LOF and VB. Monitors applied and assessing. Initial FHT 125.

## 2020-07-15 NOTE — Progress Notes (Signed)
Patient ID: Sally Hall, female   DOB: 05-02-1991, 29 y.o.   MRN: [id number]  Plan of care discussed with Dr. Logan Bores. Will discuss options for pain medication and pitocin titration with patient.    Serafina Royals, CNM [facility], [facility] 07/15/20 7:07 PM

## 2020-07-16 ENCOUNTER — Encounter: Payer: Self-pay | Admitting: Certified Nurse Midwife

## 2020-07-16 DIAGNOSIS — Z3A41 41 weeks gestation of pregnancy: Secondary | ICD-10-CM

## 2020-07-16 DIAGNOSIS — Z3A39 39 weeks gestation of pregnancy: Secondary | ICD-10-CM

## 2020-07-16 DIAGNOSIS — O480 Post-term pregnancy: Principal | ICD-10-CM

## 2020-07-16 LAB — CBC
HCT: 32.4 % — ABNORMAL LOW (ref 36.0–46.0)
Hemoglobin: 11.1 g/dL — ABNORMAL LOW (ref 12.0–15.0)
MCH: 29.4 pg (ref 26.0–34.0)
MCHC: 34.3 g/dL (ref 30.0–36.0)
MCV: 85.7 fL (ref 80.0–100.0)
Platelets: 249 10*3/uL (ref 150–400)
RBC: 3.78 MIL/uL — ABNORMAL LOW (ref 3.87–5.11)
RDW: 13.5 % (ref 11.5–15.5)
WBC: 32.3 10*3/uL — ABNORMAL HIGH (ref 4.0–10.5)
nRBC: 0 % (ref 0.0–0.2)

## 2020-07-16 MED ORDER — ACETAMINOPHEN 325 MG PO TABS: 650.0000 mg | ORAL_TABLET | ORAL | Status: DC | PRN

## 2020-07-16 MED ORDER — ONDANSETRON HCL 4 MG/2ML IJ SOLN: 4.0000 mg | INTRAMUSCULAR | Status: DC | PRN

## 2020-07-16 MED ORDER — SODIUM CHLORIDE 0.9% FLUSH: 3.0000 mL | Freq: Two times a day (BID) | INTRAVENOUS | Status: DC

## 2020-07-16 MED ORDER — ONDANSETRON HCL 4 MG PO TABS: 4.0000 mg | ORAL_TABLET | ORAL | Status: DC | PRN

## 2020-07-16 MED ORDER — SIMETHICONE 80 MG PO CHEW: 80.0000 mg | CHEWABLE_TABLET | ORAL | Status: DC | PRN

## 2020-07-16 MED ORDER — SENNOSIDES-DOCUSATE SODIUM 8.6-50 MG PO TABS: 2.0000 | ORAL_TABLET | ORAL | Status: DC

## 2020-07-16 MED ORDER — PRENATAL MULTIVITAMIN CH: 1.0000 | ORAL_TABLET | Freq: Every day | ORAL | Status: DC

## 2020-07-16 MED ORDER — COCONUT OIL OIL
1.0000 "application " | TOPICAL_OIL | Status: DC | PRN
  Filled 2020-07-16: qty 120

## 2020-07-16 MED ORDER — WITCH HAZEL-GLYCERIN EX PADS
1.0000 "application " | MEDICATED_PAD | CUTANEOUS | Status: DC | PRN
  Administered 2020-07-16: 1 via TOPICAL
  Filled 2020-07-16: qty 100

## 2020-07-16 MED ORDER — DIBUCAINE (PERIANAL) 1 % EX OINT: 1.0000 "application " | TOPICAL_OINTMENT | CUTANEOUS | Status: DC | PRN

## 2020-07-16 MED ORDER — IBUPROFEN 600 MG PO TABS: 600.0000 mg | ORAL_TABLET | Freq: Four times a day (QID) | ORAL | Status: DC | PRN

## 2020-07-16 MED ORDER — SODIUM CHLORIDE 0.9% FLUSH: 3.0000 mL | INTRAVENOUS | Status: DC | PRN

## 2020-07-16 MED ORDER — DIPHENHYDRAMINE HCL 25 MG PO CAPS: 25.0000 mg | ORAL_CAPSULE | Freq: Four times a day (QID) | ORAL | Status: DC | PRN

## 2020-07-16 MED ORDER — SODIUM CHLORIDE 0.9 % IV SOLN: 250.0000 mL | INTRAVENOUS | Status: DC | PRN

## 2020-07-16 MED ORDER — IBUPROFEN 600 MG PO TABS: 600.0000 mg | ORAL_TABLET | Freq: Four times a day (QID) | ORAL | Status: DC

## 2020-07-16 MED ORDER — METHYLERGONOVINE MALEATE 0.2 MG/ML IJ SOLN: 0.2000 mg | INTRAMUSCULAR | Status: DC | PRN

## 2020-07-16 MED ORDER — BENZOCAINE-MENTHOL 20-0.5 % EX AERO
1.0000 "application " | INHALATION_SPRAY | CUTANEOUS | Status: DC | PRN
  Administered 2020-07-16: 1 via TOPICAL
  Filled 2020-07-16: qty 56

## 2020-07-16 MED ORDER — METHYLERGONOVINE MALEATE 0.2 MG PO TABS: 0.2000 mg | ORAL_TABLET | ORAL | Status: DC | PRN

## 2020-07-16 NOTE — Lactation Note (Signed)
This note was copied from a baby's chart. Lactation Consultation Note  Patient Name: Sally Hall [id number] Date: 07/16/2020 Reason for consult: Follow-up assessment;Mother's request;Primapara;Term  LC has been assisting mom with breastfeeding all day.  Mom can easily hand express lots of colostrum.  Sally Hall prefers the right breast and the cross cradle hold latching after several attempts and sustaining the latch for long periods.  She is nursing better on the left breast now.  Once she achieves a good deep latch, she has a strong, rhythmic suck with audible swallows.  Towards the end of the breast feeding session she starts to slip off to the tip of the nipple pinching and loses suction making a clicking sound.  Mom is able to recognize a poor latch and know how to break the suction and re latch because it begins to be painful.  Demonstrated how to hand express and rub colostrum on the nipples to prevent bacteria, lubricate and help with discomfort.  Coconut oil and comfort gels given with instructions in alternating use.   Explained feeding cues and encouraged mom to put Sally Hall to the breast whenever she demonstrates hunger cues.  Mom has a Spectra DEBP and manual pump at home.  As first time parents they had lots of questions which were addressed such as when and how to pump, storage and collection questions, when and how to introduce the bottle and/or pacifier, burping, voids and stools, how often to breast feed, if they should wake her for feedings, how to know if she is getting enough milk from mom, what to do about sore nipples, how long to breast feed, when to introduce solids, diet while breast feeding etc.  Hand out given on what to expect with feedings the first 4 days of life reviewing normal newborn stomach size, adequate intake and out put, supply and demand, normal course of lactation and routine newborn feeding patterns. Lactation [employer] given and reviewed.  Lactation  name and number written on white board and encouraged to call with any questions, concerns or assistance.   Maternal Data Formula Feeding for Exclusion: No Has patient been taught Hand Expression?: Yes Does the patient have breastfeeding experience prior to this delivery?: No (Gr1)  Feeding Feeding Type: Breast Fed  LATCH Score Latch: Repeated attempts needed to sustain latch, nipple held in mouth throughout feeding, stimulation needed to elicit sucking reflex.  Audible Swallowing: A few with stimulation  Type of Nipple: Everted at rest and after stimulation  Comfort (Breast/Nipple): Filling, red/small blisters or bruises, mild/mod discomfort  Hold (Positioning): Assistance needed to correctly position infant at breast and maintain latch.  LATCH Score: 6  Interventions Interventions: Breast feeding basics reviewed;Assisted with latch;Skin to skin;Breast massage;Hand express;Reverse pressure;Breast compression;Adjust position;Support pillows;Position options;Coconut oil;Comfort gels  Lactation Tools Discussed/Used Tools: Coconut oil;Comfort gels;Other (comment) (Mom brought her own nipple cream) WIC Program: No (Self Pay) Pump Review: Setup, frequency, and cleaning;Milk Storage;Other (comment) Initiated by::  (Did not pump, but information given)   Consult Status Consult Status: Follow-up Follow-up type: Call as needed    Louis Meckel 07/16/2020, 8:43 PM

## 2020-07-16 NOTE — Progress Notes (Signed)
Progress Note - Vaginal Delivery  Sally Hall is a 29 y.o. G1P1001 now PP day 1 s/p Vaginal, Spontaneous .   Subjective:  The patient reports no complaints, up ad lib, voiding and tolerating PO   Objective:  Vital signs in last 24 hours: Temp:  [97.7 F (36.5 C)-98.5 F (36.9 C)] 97.9 F (36.6 C) (10/25 0736) Pulse Rate:  [90-116] 102 (10/25 0736) Resp:  [17-20] 17 (10/25 0736) BP: (117-158)/(76-86) 117/78 (10/25 0736) SpO2:  [98 %] 98 % (10/25 0736)  Physical Exam:  General: alert, cooperative, appears stated age and fatigued Lochia: appropriate Uterine Fundus: firm @ U   Data Review Recent Labs    07/15/20 0603 07/16/20 0637  HGB 12.0 11.1*  HCT 35.1* 32.4*    Assessment/Plan: Active Problems:   Normal vaginal delivery   Pulmonary stenosis   [redacted] weeks gestation of pregnancy   Plan for discharge tomorrow   -- Continue routine PP care.     Doreene Burke, CNM  07/16/2020 7:47 AM

## 2020-07-16 NOTE — Discharge Instructions (Signed)

## 2020-07-17 ENCOUNTER — Encounter: Payer: Self-pay | Admitting: Certified Nurse Midwife

## 2020-07-17 ENCOUNTER — Other Ambulatory Visit: Payer: Self-pay

## 2020-07-17 NOTE — Final Progress Note (Signed)
Discharge Day SOAP Note:  Progress Note - Vaginal Delivery  Sally Hall is a 29 y.o. G1P1001 now PP day 2 s/p Vaginal, Spontaneous . Delivery was uncomplicated  Subjective  The patient has the following complaints: has no unusual complaints  Pain is controlled with current medications.   Patient is urinating without difficulty.  She is ambulating well.     Objective  Vital signs: BP 125/89 (BP Location: Left Arm)   Pulse 88   Temp 98.5 F (36.9 C) (Oral)   Resp 18   Ht 5\' 7"  (1.702 m)   Wt 82.1 kg   LMP 10/10/2019 (Exact Date)   SpO2 98%   Breastfeeding Unknown   BMI 28.35 kg/m   Physical Exam: Gen: NAD Fundus Fundal Tone: Firm  Lochia Amount: Small        Data Review Labs: Lab Results  Component Value Date   WBC 32.3 (H) 07/16/2020   HGB 11.1 (L) 07/16/2020   HCT 32.4 (L) 07/16/2020   MCV 85.7 07/16/2020   PLT 249 07/16/2020   CBC Latest Ref Rng & Units 07/16/2020 07/15/2020 04/23/2020  WBC 4.0 - 10.5 K/uL 32.3(H) 19.1(H) 16.5(H)  Hemoglobin 12.0 - 15.0 g/dL 11.1(L) 12.0 11.7  Hematocrit 36 - 46 % 32.4(L) 35.1(L) 33.9(L)  Platelets 150 - 400 K/uL 249 232 267   AB POS Performed at [facility], [address] [place], [place]   [id number] Score: Inocente Salles Postnatal Depression Scale Screening Tool 07/16/2020  I have been able to laugh and see the funny side of things. 0  I have looked forward with enjoyment to things. 0  I have blamed myself unnecessarily when things went wrong. 1  I have been anxious or worried for no good reason. 0  I have felt scared or panicky for no good reason. 0  Things have been getting on top of me. 0  I have been so unhappy that I have had difficulty sleeping. 0  I have felt sad or miserable. 0  I have been so unhappy that I have been crying. 0  The thought of harming myself has occurred to me. 0  Edinburgh Postnatal Depression Scale Total 1    Assessment/Plan  Active Problems:    Normal vaginal delivery   Pulmonary stenosis   [redacted] weeks gestation of pregnancy    Plan for discharge today.  Discharge Instructions: Per After Visit Summary. Activity: Advance as tolerated. Pelvic rest for 6 weeks.  Also refer to After Visit Summary Diet: Regular Medications: Allergies as of 07/17/2020      Reactions   Penicillins       Medication List    TAKE these medications   prenatal multivitamin Tabs tablet Take 1 tablet by mouth daily at 12 noon.      Outpatient follow up: 2 wks tele visit , 6 wks postpartum visit with 07/19/2020, CNM   Follow-up Information    [facility]. Schedule an appointment as soon as possible for a visit in 6 week(s).   Why: postpartum follow up Contact information: [address]  [address] [place] [phone]             Postpartum contraception: Will discuss at first office visit post-partum, she is undecided  Discharged Condition: good  Discharged to: home  Newborn Data: Disposition:home with mother  Apgars: APGAR (1 MIN): 8   APGAR (5 MINS): 9   APGAR (10 MINS):    Baby Feeding: Breast  Doreene Burke, CNM  07/17/2020 8:19 AM

## 2020-07-17 NOTE — Progress Notes (Signed)
Patient discharged home with infant. Discharge instructions given and reviewed with patient. Patient verbalized understanding. Escorted out by auxillary.  

## 2020-07-17 NOTE — Discharge Summary (Signed)
Patient Name: Sally Hall DOB: 27-Dec-1990 MRN: [id number]                            Discharge Summary  Date of Admission: 07/15/2020 Date of Discharge: 07/17/2020 Delivering Provider: Holly Bodily MICHELLE   Admitting Diagnosis: Normal labor [O80, Z37.9] at [redacted]w[redacted]d Secondary diagnosis:  Active Problems:   Normal vaginal delivery   Pulmonary stenosis   [redacted] weeks gestation of pregnancy  Mode of Delivery: normal spontaneous vaginal delivery              Discharge diagnosis: Term Pregnancy Delivered      Intrapartum Procedures: none   Post partum procedures: none  Complications: 1 degree perineal laceration                     Discharge Day SOAP Note:  Progress Note - Vaginal Delivery  Sally Hall is a 29 y.o. G1P1001 now PP day 2 s/p Vaginal, Spontaneous . Delivery was uncomplicated  Subjective  The patient has the following complaints: has no unusual complaints  Pain is controlled with current medications.   Patient is urinating without difficulty.  She is ambulating well.     Objective  Vital signs: BP 125/89 (BP Location: Left Arm)   Pulse 88   Temp 98.5 F (36.9 C) (Oral)   Resp 18   Ht 5\' 7"  (1.702 m)   Wt 82.1 kg   LMP 10/10/2019 (Exact Date)   SpO2 98%   Breastfeeding Unknown   BMI 28.35 kg/m   Physical Exam: Gen: NAD Fundus Fundal Tone: Firm  Lochia Amount: Small        Data Review Labs: Lab Results  Component Value Date   WBC 32.3 (H) 07/16/2020   HGB 11.1 (L) 07/16/2020   HCT 32.4 (L) 07/16/2020   MCV 85.7 07/16/2020   PLT 249 07/16/2020   CBC Latest Ref Rng & Units 07/16/2020 07/15/2020 04/23/2020  WBC 4.0 - 10.5 K/uL 32.3(H) 19.1(H) 16.5(H)  Hemoglobin 12.0 - 15.0 g/dL 11.1(L) 12.0 11.7  Hematocrit 36 - 46 % 32.4(L) 35.1(L) 33.9(L)  Platelets 150 - 400 K/uL 249 232 267   AB POS Performed at [facility], [address] [place], [place]   [address] Score: Inocente Salles Postnatal Depression Scale  Screening Tool 07/16/2020  I have been able to laugh and see the funny side of things. 0  I have looked forward with enjoyment to things. 0  I have blamed myself unnecessarily when things went wrong. 1  I have been anxious or worried for no good reason. 0  I have felt scared or panicky for no good reason. 0  Things have been getting on top of me. 0  I have been so unhappy that I have had difficulty sleeping. 0  I have felt sad or miserable. 0  I have been so unhappy that I have been crying. 0  The thought of harming myself has occurred to me. 0  Edinburgh Postnatal Depression Scale Total 1    Assessment/Plan  Active Problems:   Normal vaginal delivery   Pulmonary stenosis   [redacted] weeks gestation of pregnancy    Plan for discharge today.  Discharge Instructions: Per After Visit Summary. Activity: Advance as tolerated. Pelvic rest for 6 weeks.  Also refer to After Visit Summary Diet: Regular Medications: Allergies as of 07/17/2020      Reactions   Penicillins  Medication List    TAKE these medications   prenatal multivitamin Tabs tablet Take 1 tablet by mouth daily at 12 noon.      Outpatient follow up: 2 wks tele visit , 6 wks postpartum visit with Serafina Royals, CNM   Follow-up Information    [facility]. Schedule an appointment as soon as possible for a visit in 6 week(s).   Why: postpartum follow up Contact information: [address]  [address] [place] [address] [phone]             Postpartum contraception: Will discuss at first office visit post-partum, she is undecided  Discharged Condition: good  Discharged to: home  Newborn Data: Disposition:home with mother  Apgars: APGAR (1 MIN): 8   APGAR (5 MINS): 9   APGAR (10 MINS):    Baby Feeding: Breast    Doreene Burke, CNM  07/17/2020 8:19 AM

## 2020-07-17 NOTE — Lactation Note (Signed)
This note was copied from a baby's chart. Lactation Consultation Note  Patient Name: Sally Hall Date: 07/17/2020 Reason for consult: Follow-up assessment;1st time breastfeeding;Term  Lactation follow-up before discharge.  [facility] student led discharge conversation reviewing overnight feeding, confidence in breastfeeding, breastfeeding basics and what to expect in the days to come, milk supply and demand, and normal course of lactation. Parents had questions re: pacifier use, LC student was able to address the pacifier use recommendations, encouraging to delay introduction, appropriate age for introduction. Reviewed early cues, feeding on demand, output expectations, growth spurts and cluster feeding, tips for keeping baby awake at the breast, and optimum transfer.  LC provided mom with guidance on breast care, fullness and engorgement and management of both. Information for outpatient lactation services and community breastfeeding support given. Parents encouraged to call out today if needed before going home.  Maternal Data Formula Feeding for Exclusion: No Has patient been taught Hand Expression?: Yes Does the patient have breastfeeding experience prior to this delivery?: No  Feeding Feeding Type: Breast Fed  LATCH Score                   Interventions Interventions: Breast feeding basics reviewed  Lactation Tools Discussed/Used     Consult Status Consult Status: Complete Date: 07/17/20 Follow-up type: Call as needed    Danford Bad 07/17/2020, 9:36 AM

## 2020-07-30 ENCOUNTER — Encounter: Payer: Self-pay | Admitting: Certified Nurse Midwife

## 2020-07-30 ENCOUNTER — Ambulatory Visit (INDEPENDENT_AMBULATORY_CARE_PROVIDER_SITE_OTHER): Payer: Self-pay | Admitting: Certified Nurse Midwife

## 2020-07-30 ENCOUNTER — Other Ambulatory Visit: Payer: Self-pay

## 2020-07-30 DIAGNOSIS — Z1331 Encounter for screening for depression: Secondary | ICD-10-CM

## 2020-07-30 NOTE — Progress Notes (Signed)
Virtual Visit via Telephone Note  I connected with Cleatis Polka on 07/30/20 at 11:15 AM EST by telephone and verified that I am speaking with the correct person using two identifiers.  Location:  Patient: Sally Hall (home)  Provider: Serafina Royals, CNM (office)   I discussed the limitations, risks, security and privacy concerns of performing an evaluation and management service by telephone and the availability of in person appointments. I also discussed with the patient that there may be a patient responsible charge related to this service. The patient expressed understanding and agreed to proceed.   History of Present Illness:  Patient is two (2) week postpartum after spontaneous vaginal birth.   Doing well overall. Feels confident and has a circle of support.   Light bleeding, more with activity.   Breastfeeding is going better this week, infant feeding from both sides.   Sleeping at night for longer periods before the baby wakes.   Denies difficulty breathing or respiratory distress, chest pain, abdominal pain, excessive vaginal bleeding, dysuria, and leg pain or swelling.    Observations/Objective:  Edinburgh Postnatal Depression Scale Screening Tool 07/30/2020 07/16/2020 07/16/2020  I have been able to laugh and see the funny side of things. 0 0 (No Data)  I have looked forward with enjoyment to things. 0 0 -  I have blamed myself unnecessarily when things went wrong. 0 1 -  I have been anxious or worried for no good reason. 0 0 -  I have felt scared or panicky for no good reason. 0 0 -  Things have been getting on top of me. 0 0 -  I have been so unhappy that I have had difficulty sleeping. 0 0 -  I have felt sad or miserable. 0 0 -  I have been so unhappy that I have been crying. 0 0 -  The thought of harming myself has occurred to me. 0 0 -  Edinburgh Postnatal Depression Scale Total 0 1 -     Assessment and Plan:  Postpartum care and  examination Depression screening negative  Follow Up Instructions:  RTC on 08/27/2020 for PPV as previously scheduled or sooner if needed.    I discussed the assessment and treatment plan with the patient. The patient was provided an opportunity to ask questions and all were answered. The patient agreed with the plan and demonstrated an understanding of the instructions.   The patient was advised to call back or seek an in-person evaluation if the symptoms worsen or if the condition fails to improve as anticipated.  I provided 5 minutes of non-face-to-face time during this encounter.   Serafina Royals, CNM [facility], [facility] 07/30/20 11:40 AM

## 2020-07-30 NOTE — Progress Notes (Signed)
Televisit-pt having televisit for PP depression. Pt stated that she was doing well and denies any issues at this time. EPDS=0.

## 2020-08-07 ENCOUNTER — Telehealth: Payer: Self-pay

## 2020-08-07 NOTE — Telephone Encounter (Signed)
Pt called in and stated that she is needing her ob payment typed up or signed and date. The pt said that she is needing CPT codes as well as the provider inform or a letter head. The pt uses self pay the the pt is under the [facility]. I told the pt I will send a message to our practice manager and to please allow 24- 48 hours for a reply.

## 2020-08-08 NOTE — Telephone Encounter (Signed)
Called pt about her ob payment plan. The pt says that the one she has was rejected due to not having all the information they need to fill a claim. The pt is requesting [place] to type up her Ob payment plan, with CPT codes and Cost. The pt is also requesting a her payments she made at [facility] on the paperwork. I told the pt I will be working on that and once I type it up I will call her and go over it with her. The pt verbally understood

## 2020-08-13 ENCOUNTER — Telehealth: Payer: Self-pay

## 2020-08-27 ENCOUNTER — Encounter: Payer: Self-pay | Admitting: Certified Nurse Midwife

## 2020-08-27 ENCOUNTER — Ambulatory Visit (INDEPENDENT_AMBULATORY_CARE_PROVIDER_SITE_OTHER): Payer: Self-pay | Admitting: Certified Nurse Midwife

## 2020-08-27 ENCOUNTER — Other Ambulatory Visit: Payer: Self-pay

## 2020-08-27 DIAGNOSIS — Z1331 Encounter for screening for depression: Secondary | ICD-10-CM

## 2020-08-27 NOTE — Progress Notes (Signed)
Subjective:    Sally Hall is a 29 y.o. G48P1001 Caucasian female who presents for a postpartum visit. She is 6 weeks postpartum following a spontaneous vaginal delivery at 41 gestational weeks. Anesthesia: local. I have fully reviewed the prenatal and intrapartum course.   Postpartum course has been uncomplicated.   Baby's course has been uncomplicated. Baby is feeding by breast.   Reports red brown bleeding for the last three (3) days that is now resolving.   Bowel function is normal. Bladder function is normal.   Patient is not sexually active.  Contraception method is condoms.   Postpartum depression screening: negative. Score 0.    Last pap 01/03/2020 and was Neg/Neg.  Denies difficulty breathing or respiratory distress, chest pain, abdominal pain, excessive vaginal bleeding, dysuria, and leg pain or swelling.   The following portions of the patient's history were reviewed and updated as appropriate: allergies, current medications, past medical history, past surgical history and problem list.  Review of Systems  Pertinent items are noted in HPI.   Objective:   BP 107/79   Pulse 89   Ht 5\' 7"  (1.702 m)   Wt 162 lb 1.6 oz (73.5 kg)   LMP 08/25/2020   Breastfeeding Yes   BMI 25.39 kg/m   General:  Alert, cooperative and no distress   Breasts:  Deferred, no complaints  Lungs: Clear to auscultation bilaterally  Heart:  Regular rate and rhythm  Abdomen: Soft, nontender   Vulva: Normal  Vagina: Normal vagina  Cervix:  Closed  Corpus: Well-involuted  Adnexa:  Non-palpable  Rectal Exam: No hemorrhoids        Depression screen PHQ 2/9 08/27/2020  Decreased Interest 0  Down, Depressed, Hopeless 0  PHQ - 2 Score 0  Altered sleeping 0  Tired, decreased energy 0  Change in appetite 0  Feeling bad or failure about yourself  0  Trouble concentrating 0  Moving slowly or fidgety/restless 0  Suicidal thoughts 0  PHQ-9 Score 0    GAD 7 : Generalized Anxiety Score  08/27/2020  Nervous, Anxious, on Edge 0  Control/stop worrying 0  Worry too much - different things 0  Trouble relaxing 0  Restless 0  Easily annoyed or irritable 0  Afraid - awful might happen 0  Total GAD 7 Score 0     Assessment:   Postpartum exam Six (6) wks s/p spontaneous vaginal birth Breastfeeding Depression screening Contraception counseling   Plan:   Encouraged routine health maintenance techniques.   Reviewed red flag symptoms and when to call.   Follow up in: 4 months for ANNUAL EXAM or earlier if needed.   14/02/2020, CNM [facility], [facility] 08/27/20 12:10 PM

## 2020-08-27 NOTE — Patient Instructions (Signed)
Preventive Care 21-30 Years Old, Female Preventive care refers to visits with your health care provider and lifestyle choices that can promote health and wellness. This includes:  A yearly physical exam. This may also be called an annual well check.  Regular dental visits and eye exams.  Immunizations.  Screening for certain conditions.  Healthy lifestyle choices, such as eating a healthy diet, getting regular exercise, not using drugs or products that contain nicotine and tobacco, and limiting alcohol use. What can I expect for my preventive care visit? Physical exam Your health care provider will check your:  Height and weight. This may be used to calculate body mass index (BMI), which tells if you are at a healthy weight.  Heart rate and blood pressure.  Skin for abnormal spots. Counseling Your health care provider may ask you questions about your:  Alcohol, tobacco, and drug use.  Emotional well-being.  Home and relationship well-being.  Sexual activity.  Eating habits.  Work and work environment.  Method of birth control.  Menstrual cycle.  Pregnancy history. What immunizations do I need?  Influenza (flu) vaccine  This is recommended every year. Tetanus, diphtheria, and pertussis (Tdap) vaccine  You may need a Td booster every 10 years. Varicella (chickenpox) vaccine  You may need this if you have not been vaccinated. Human papillomavirus (HPV) vaccine  If recommended by your health care provider, you may need three doses over 6 months. Measles, mumps, and rubella (MMR) vaccine  You may need at least one dose of MMR. You may also need a second dose. Meningococcal conjugate (MenACWY) vaccine  One dose is recommended if you are age 19-21 years and a first-year college student living in a residence hall, or if you have one of several medical conditions. You may also need additional booster doses. Pneumococcal conjugate (PCV13) vaccine  You may need  this if you have certain conditions and were not previously vaccinated. Pneumococcal polysaccharide (PPSV23) vaccine  You may need one or two doses if you smoke cigarettes or if you have certain conditions. Hepatitis A vaccine  You may need this if you have certain conditions or if you travel or work in places where you may be exposed to hepatitis A. Hepatitis B vaccine  You may need this if you have certain conditions or if you travel or work in places where you may be exposed to hepatitis B. Haemophilus influenzae type b (Hib) vaccine  You may need this if you have certain conditions. You may receive vaccines as individual doses or as more than one vaccine together in one shot (combination vaccines). Talk with your health care provider about the risks and benefits of combination vaccines. What tests do I need?  Blood tests  Lipid and cholesterol levels. These may be checked every 5 years starting at age 20.  Hepatitis C test.  Hepatitis B test. Screening  Diabetes screening. This is done by checking your blood sugar (glucose) after you have not eaten for a while (fasting).  Sexually transmitted disease (STD) testing.  BRCA-related cancer screening. This may be done if you have a family history of breast, ovarian, tubal, or peritoneal cancers.  Pelvic exam and Pap test. This may be done every 3 years starting at age 21. Starting at age 30, this may be done every 5 years if you have a Pap test in combination with an HPV test. Talk with your health care provider about your test results, treatment options, and if necessary, the need for more tests.   Follow these instructions at home: Eating and drinking   Eat a diet that includes fresh fruits and vegetables, whole grains, lean protein, and low-fat dairy.  Take vitamin and mineral supplements as recommended by your health care provider.  Do not drink alcohol if: ? Your health care provider tells you not to drink. ? You are  pregnant, may be pregnant, or are planning to become pregnant.  If you drink alcohol: ? Limit how much you have to 0-1 drink a day. ? Be aware of how much alcohol is in your drink. In the [place] one drink equals one 12 oz bottle of beer (355 mL), one 5 oz glass of wine (148 mL), or one 1 oz glass of hard liquor (44 mL). Lifestyle  Take daily care of your teeth and gums.  Stay active. Exercise for at least 30 minutes on 5 or more days each week.  Do not use any products that contain nicotine or tobacco, such as cigarettes, e-cigarettes, and chewing tobacco. If you need help quitting, ask your health care provider.  If you are sexually active, practice safe sex. Use a condom or other form of birth control (contraception) in order to prevent pregnancy and STIs (sexually transmitted infections). If you plan to become pregnant, see your health care provider for a preconception visit. What's next?  Visit your health care provider once a year for a well check visit.  Ask your health care provider how often you should have your eyes and teeth checked.  Stay up to date on all vaccines. This information is not intended to replace advice given to you by your health care provider. Make sure you discuss any questions you have with your health care provider. Document Revised: 05/20/2018 Document Reviewed: 05/20/2018 Elsevier Patient Education  2020 [employer].

## 2020-08-27 NOTE — Progress Notes (Signed)
Pt present for 6 week postpartum care. No complaints.

## 2020-10-23 NOTE — Telephone Encounter (Signed)
error 

## 2023-07-23 LAB — OB RESULTS CONSOLE PLATELET COUNT
Platelets: 249
Platelets: 249

## 2023-07-23 LAB — OB RESULTS CONSOLE HGB/HCT, BLOOD
HCT: 37 (ref 29–41)
HCT: 37 (ref 29–41)
Hemoglobin: 12.5
Hemoglobin: 12.5

## 2023-07-23 LAB — OB RESULTS CONSOLE RUBELLA ANTIBODY, IGM
Rubella: NON-IMMUNE/NOT IMMUNE
Rubella: UNDETERMINED

## 2023-11-16 LAB — OB RESULTS CONSOLE GBS: GBS: NEGATIVE

## 2023-11-17 LAB — OB RESULTS CONSOLE GBS: GBS: NEGATIVE

## 2023-12-10 ENCOUNTER — Encounter
Admission: EM | Disposition: A | Discharge status: Home / Self Care | Payer: Self-pay | Source: Home / Self Care | Attending: Obstetrics and Gynecology

## 2023-12-10 ENCOUNTER — Other Ambulatory Visit: Payer: Self-pay

## 2023-12-10 ENCOUNTER — Inpatient Hospital Stay: Payer: MEDICAID | Admitting: Certified Registered"

## 2023-12-10 ENCOUNTER — Inpatient Hospital Stay
Admission: EM | Admit: 2023-12-10 | Discharge: 2023-12-11 | DRG: 788 | Disposition: A | Discharge status: Home / Self Care | Payer: MEDICAID | Attending: Obstetrics and Gynecology | Admitting: Obstetrics and Gynecology

## 2023-12-10 ENCOUNTER — Inpatient Hospital Stay: Admit: 2023-12-10 | Payer: PRIVATE HEALTH INSURANCE | Admitting: Obstetrics and Gynecology

## 2023-12-10 DIAGNOSIS — Z98891 History of uterine scar from previous surgery: Principal | ICD-10-CM

## 2023-12-10 DIAGNOSIS — O321XX0 Maternal care for breech presentation, not applicable or unspecified: Secondary | ICD-10-CM | POA: Diagnosis present

## 2023-12-10 DIAGNOSIS — Z833 Family history of diabetes mellitus: Secondary | ICD-10-CM

## 2023-12-10 DIAGNOSIS — Z880 Allergy status to penicillin: Secondary | ICD-10-CM | POA: Diagnosis not present

## 2023-12-10 DIAGNOSIS — Z3A39 39 weeks gestation of pregnancy: Secondary | ICD-10-CM | POA: Diagnosis not present

## 2023-12-10 DIAGNOSIS — O328XX0 Maternal care for other malpresentation of fetus, not applicable or unspecified: Secondary | ICD-10-CM | POA: Diagnosis present

## 2023-12-10 DIAGNOSIS — Z3490 Encounter for supervision of normal pregnancy, unspecified, unspecified trimester: Secondary | ICD-10-CM

## 2023-12-10 DIAGNOSIS — O329XX0 Maternal care for malpresentation of fetus, unspecified, not applicable or unspecified: Principal | ICD-10-CM | POA: Diagnosis present

## 2023-12-10 LAB — CBC
HCT: 34.5 % — ABNORMAL LOW (ref 36.0–46.0)
Hemoglobin: 11.2 g/dL — ABNORMAL LOW (ref 12.0–15.0)
MCH: 25.5 pg — ABNORMAL LOW (ref 26.0–34.0)
MCHC: 32.5 g/dL (ref 30.0–36.0)
MCV: 78.6 fL — ABNORMAL LOW (ref 80.0–100.0)
Platelets: 333 10*3/uL (ref 150–400)
RBC: 4.39 MIL/uL (ref 3.87–5.11)
RDW: 13.3 % (ref 11.5–15.5)
WBC: 24.2 10*3/uL — ABNORMAL HIGH (ref 4.0–10.5)
nRBC: 0 % (ref 0.0–0.2)

## 2023-12-10 LAB — TYPE AND SCREEN
ABO/RH(D): AB POS
Antibody Screen: NEGATIVE

## 2023-12-10 SURGERY — Surgical Case
Anesthesia: Spinal

## 2023-12-10 MED ORDER — ONDANSETRON HCL 4 MG/2ML IJ SOLN
INTRAMUSCULAR | Status: AC
  Filled 2023-12-10: qty 2

## 2023-12-10 MED ORDER — KETOROLAC TROMETHAMINE 30 MG/ML IJ SOLN: 30.0000 mg | Freq: Four times a day (QID) | INTRAMUSCULAR | Status: AC | PRN

## 2023-12-10 MED ORDER — SODIUM CHLORIDE 0.9 % IV SOLN
500.0000 mg | INTRAVENOUS | Status: AC
  Administered 2023-12-10: 500 mg via INTRAVENOUS
  Filled 2023-12-10: qty 5

## 2023-12-10 MED ORDER — NALOXONE HCL 0.4 MG/ML IJ SOLN: 0.4000 mg | INTRAMUSCULAR | Status: DC | PRN

## 2023-12-10 MED ORDER — IBUPROFEN 600 MG PO TABS
600.0000 mg | ORAL_TABLET | Freq: Four times a day (QID) | ORAL | Status: DC
  Administered 2023-12-11: 600 mg via ORAL
  Filled 2023-12-10 (×2): qty 1

## 2023-12-10 MED ORDER — ONDANSETRON HCL 4 MG/2ML IJ SOLN: 4.0000 mg | Freq: Three times a day (TID) | INTRAMUSCULAR | Status: DC | PRN

## 2023-12-10 MED ORDER — AMMONIA AROMATIC IN INHA
RESPIRATORY_TRACT | Status: AC
  Filled 2023-12-10: qty 10

## 2023-12-10 MED ORDER — PRENATAL MULTIVITAMIN CH
1.0000 | ORAL_TABLET | Freq: Every day | ORAL | Status: DC
  Administered 2023-12-11: 1 via ORAL
  Filled 2023-12-10: qty 1

## 2023-12-10 MED ORDER — FENTANYL CITRATE (PF) 100 MCG/2ML IJ SOLN
INTRAMUSCULAR | Status: AC
  Filled 2023-12-10: qty 2

## 2023-12-10 MED ORDER — MORPHINE SULFATE (PF) 0.5 MG/ML IJ SOLN
INTRAMUSCULAR | Status: DC | PRN
  Administered 2023-12-10: .1 mg via INTRATHECAL

## 2023-12-10 MED ORDER — OXYTOCIN 10 UNIT/ML IJ SOLN
INTRAMUSCULAR | Status: AC
  Filled 2023-12-10: qty 2

## 2023-12-10 MED ORDER — ACETAMINOPHEN 325 MG PO TABS: 650.0000 mg | ORAL_TABLET | ORAL | Status: DC | PRN

## 2023-12-10 MED ORDER — BUPIVACAINE IN DEXTROSE 0.75-8.25 % IT SOLN
INTRATHECAL | Status: DC | PRN
  Administered 2023-12-10: 1.6 mL via INTRATHECAL

## 2023-12-10 MED ORDER — FENTANYL CITRATE (PF) 100 MCG/2ML IJ SOLN
INTRAMUSCULAR | Status: DC | PRN
  Administered 2023-12-10: 15 ug via INTRATHECAL

## 2023-12-10 MED ORDER — OXYTOCIN-SODIUM CHLORIDE 30-0.9 UT/500ML-% IV SOLN
2.5000 [IU]/h | INTRAVENOUS | Status: AC
Start: 2023-12-10 — End: 2023-12-11
  Administered 2023-12-10: 2.5 [IU]/h via INTRAVENOUS
  Filled 2023-12-10: qty 500

## 2023-12-10 MED ORDER — SIMETHICONE 80 MG PO CHEW
80.0000 mg | CHEWABLE_TABLET | Freq: Four times a day (QID) | ORAL | Status: DC
  Administered 2023-12-11 (×2): 80 mg via ORAL
  Filled 2023-12-10 (×3): qty 1

## 2023-12-10 MED ORDER — ONDANSETRON HCL 4 MG/2ML IJ SOLN: 4.0000 mg | Freq: Four times a day (QID) | INTRAMUSCULAR | Status: DC | PRN

## 2023-12-10 MED ORDER — LACTATED RINGERS IV SOLN: INTRAVENOUS | Status: DC

## 2023-12-10 MED ORDER — LIDOCAINE HCL (PF) 1 % IJ SOLN
INTRAMUSCULAR | Status: AC
  Filled 2023-12-10: qty 30

## 2023-12-10 MED ORDER — SCOPOLAMINE 1 MG/3DAYS TD PT72: 1.0000 | MEDICATED_PATCH | Freq: Once | TRANSDERMAL | Status: DC

## 2023-12-10 MED ORDER — PHENYLEPHRINE HCL-NACL 20-0.9 MG/250ML-% IV SOLN
INTRAVENOUS | Status: DC | PRN
  Administered 2023-12-10: 50 ug/min via INTRAVENOUS

## 2023-12-10 MED ORDER — KETOROLAC TROMETHAMINE 30 MG/ML IJ SOLN
30.0000 mg | Freq: Four times a day (QID) | INTRAMUSCULAR | Status: AC | PRN
  Administered 2023-12-11 (×2): 30 mg via INTRAVENOUS
  Filled 2023-12-10 (×2): qty 1

## 2023-12-10 MED ORDER — DIPHENHYDRAMINE HCL 25 MG PO CAPS: 25.0000 mg | ORAL_CAPSULE | ORAL | Status: DC | PRN

## 2023-12-10 MED ORDER — OXYCODONE HCL 5 MG PO TABS: 5.0000 mg | ORAL_TABLET | Freq: Four times a day (QID) | ORAL | Status: DC | PRN

## 2023-12-10 MED ORDER — DIPHENHYDRAMINE HCL 25 MG PO CAPS: 25.0000 mg | ORAL_CAPSULE | Freq: Four times a day (QID) | ORAL | Status: DC | PRN

## 2023-12-10 MED ORDER — OXYTOCIN-SODIUM CHLORIDE 30-0.9 UT/500ML-% IV SOLN
INTRAVENOUS | Status: AC
  Filled 2023-12-10: qty 1000

## 2023-12-10 MED ORDER — PHENYLEPHRINE HCL-NACL 20-0.9 MG/250ML-% IV SOLN
INTRAVENOUS | Status: AC
  Filled 2023-12-10: qty 250

## 2023-12-10 MED ORDER — CEFAZOLIN SODIUM-DEXTROSE 2-4 GM/100ML-% IV SOLN
2.0000 g | INTRAVENOUS | Status: AC
  Administered 2023-12-10: 2 g via INTRAVENOUS
  Filled 2023-12-10: qty 100

## 2023-12-10 MED ORDER — DEXAMETHASONE SODIUM PHOSPHATE 10 MG/ML IJ SOLN
INTRAMUSCULAR | Status: AC
  Filled 2023-12-10: qty 1

## 2023-12-10 MED ORDER — MEPERIDINE HCL 25 MG/ML IJ SOLN: 6.2500 mg | INTRAMUSCULAR | Status: DC | PRN

## 2023-12-10 MED ORDER — LACTATED RINGERS IV SOLN: 500.0000 mL | INTRAVENOUS | Status: DC | PRN

## 2023-12-10 MED ORDER — DIPHENHYDRAMINE HCL 50 MG/ML IJ SOLN: 12.5000 mg | INTRAMUSCULAR | Status: DC | PRN

## 2023-12-10 MED ORDER — LIDOCAINE HCL (PF) 1 % IJ SOLN: 30.0000 mL | INTRAMUSCULAR | Status: DC | PRN

## 2023-12-10 MED ORDER — SOD CITRATE-CITRIC ACID 500-334 MG/5ML PO SOLN
ORAL | Status: AC
  Administered 2023-12-10: 30 mL via ORAL
  Filled 2023-12-10: qty 15

## 2023-12-10 MED ORDER — OXYTOCIN-SODIUM CHLORIDE 30-0.9 UT/500ML-% IV SOLN
2.5000 [IU]/h | INTRAVENOUS | Status: DC
  Administered 2023-12-10: 30 [IU] via INTRAVENOUS

## 2023-12-10 MED ORDER — DEXAMETHASONE SODIUM PHOSPHATE 10 MG/ML IJ SOLN
INTRAMUSCULAR | Status: DC | PRN
  Administered 2023-12-10: 10 mg via INTRAVENOUS

## 2023-12-10 MED ORDER — OXYTOCIN BOLUS FROM INFUSION: 333.0000 mL | Freq: Once | INTRAVENOUS | Status: DC

## 2023-12-10 MED ORDER — OXYCODONE-ACETAMINOPHEN 5-325 MG PO TABS: 1.0000 | ORAL_TABLET | ORAL | Status: DC | PRN

## 2023-12-10 MED ORDER — SENNOSIDES-DOCUSATE SODIUM 8.6-50 MG PO TABS: 2.0000 | ORAL_TABLET | ORAL | Status: DC

## 2023-12-10 MED ORDER — ZOLPIDEM TARTRATE 5 MG PO TABS: 5.0000 mg | ORAL_TABLET | Freq: Every evening | ORAL | Status: DC | PRN

## 2023-12-10 MED ORDER — SOD CITRATE-CITRIC ACID 500-334 MG/5ML PO SOLN: 30.0000 mL | ORAL | Status: DC | PRN

## 2023-12-10 MED ORDER — ONDANSETRON HCL 4 MG/2ML IJ SOLN
INTRAMUSCULAR | Status: DC | PRN
  Administered 2023-12-10: 4 mg via INTRAVENOUS

## 2023-12-10 MED ORDER — MENTHOL 3 MG MT LOZG: 1.0000 | LOZENGE | OROMUCOSAL | Status: DC | PRN

## 2023-12-10 MED ORDER — PROPOFOL 10 MG/ML IV BOLUS
INTRAVENOUS | Status: AC
  Filled 2023-12-10: qty 20

## 2023-12-10 MED ORDER — CEFAZOLIN SODIUM-DEXTROSE 2-4 GM/100ML-% IV SOLN
INTRAVENOUS | Status: AC
  Filled 2023-12-10: qty 100

## 2023-12-10 MED ORDER — NALOXONE HCL 4 MG/10ML IJ SOLN
1.0000 ug/kg/h | INTRAVENOUS | Status: DC | PRN
  Filled 2023-12-10: qty 5

## 2023-12-10 MED ORDER — SODIUM CHLORIDE 0.9% FLUSH: 3.0000 mL | INTRAVENOUS | Status: DC | PRN

## 2023-12-10 MED ORDER — MISOPROSTOL 200 MCG PO TABS
ORAL_TABLET | ORAL | Status: AC
  Filled 2023-12-10: qty 4

## 2023-12-10 MED ORDER — LIDOCAINE 5 % EX PTCH
MEDICATED_PATCH | CUTANEOUS | Status: AC
  Filled 2023-12-10: qty 1

## 2023-12-10 MED ORDER — MORPHINE SULFATE (PF) 0.5 MG/ML IJ SOLN
INTRAMUSCULAR | Status: AC
  Filled 2023-12-10: qty 10

## 2023-12-10 SURGICAL SUPPLY — 31 items
ADHESIVE MASTISOL STRL (MISCELLANEOUS) ×1 IMPLANT
BAG COUNTER SPONGE SURGICOUNT (BAG) ×1 IMPLANT
BENZOIN TINCTURE PRP APPL 2/3 (GAUZE/BANDAGES/DRESSINGS) IMPLANT
CHLORAPREP W/TINT 26 (MISCELLANEOUS) ×2 IMPLANT
CLIP FILSHIE TUBAL LIGA STRL (Clip) IMPLANT
CLOSURE STERI-STRIP 1/4X4 (GAUZE/BANDAGES/DRESSINGS) IMPLANT
DRESSING TELFA 8X10 (GAUZE/BANDAGES/DRESSINGS) IMPLANT
DRSG CURAFIL 4X4 STRL (GAUZE/BANDAGES/DRESSINGS) IMPLANT
DRSG TELFA 3X8 NADH STRL (GAUZE/BANDAGES/DRESSINGS) ×1 IMPLANT
GAUZE CURAFIL 4X4 (GAUZE/BANDAGES/DRESSINGS) IMPLANT
GAUZE SPONGE 4X4 12PLY STRL (GAUZE/BANDAGES/DRESSINGS) ×1 IMPLANT
GAUZE SPONGE 4X4 16PLY XRAY LF (GAUZE/BANDAGES/DRESSINGS) IMPLANT
GLOVE PI ORTHO PRO STRL 7.5 (GLOVE) ×1 IMPLANT
GOWN STRL REUS W/ TWL LRG LVL3 (GOWN DISPOSABLE) ×2 IMPLANT
KIT TURNOVER KIT A (KITS) ×1 IMPLANT
MANIFOLD NEPTUNE II (INSTRUMENTS) ×1 IMPLANT
MAT PREVALON FULL STRYKER (MISCELLANEOUS) ×1 IMPLANT
NS IRRIG 1000ML POUR BTL (IV SOLUTION) ×1 IMPLANT
PACK C SECTION AR (MISCELLANEOUS) ×1 IMPLANT
PAD OB MATERNITY 11 LF (PERSONAL CARE ITEMS) ×1 IMPLANT
PAD PREP OB/GYN DISP 24X41 (PERSONAL CARE ITEMS) ×1 IMPLANT
RETRACTOR TRAXI PANNICULUS (MISCELLANEOUS) IMPLANT
RETRACTOR WND ALEXIS-O 25 LRG (MISCELLANEOUS) ×1 IMPLANT
RTRCTR WOUND ALEXIS O 25CM LRG (MISCELLANEOUS) ×1 IMPLANT
SCRUB CHG 4% DYNA-HEX 4OZ (MISCELLANEOUS) ×1 IMPLANT
SPONGE T-LAP 18X18 ~~LOC~~+RFID (SPONGE) ×1 IMPLANT
SUT VIC AB 0 CTX36XBRD ANBCTRL (SUTURE) ×2 IMPLANT
SUT VIC AB 1 CT1 36 (SUTURE) ×2 IMPLANT
SUT VICRYL+ 3-0 36IN CT-1 (SUTURE) ×2 IMPLANT
TRAP FLUID SMOKE EVACUATOR (MISCELLANEOUS) ×1 IMPLANT
WATER STERILE IRR 500ML POUR (IV SOLUTION) ×1 IMPLANT

## 2023-12-10 NOTE — Anesthesia Procedure Notes (Signed)
 Spinal  Patient location during procedure: OR Reason for block: surgical anesthesia Staffing Performed: resident/CRNA  Anesthesiologist: Louie Boston, MD Resident/CRNA: Mathews Argyle, CRNA Performed by: Mathews Argyle, CRNA Authorized by: Louie Boston, MD   Preanesthetic Checklist Completed: patient identified, IV checked, site marked, risks and benefits discussed, surgical consent, monitors and equipment checked, pre-op evaluation and timeout performed Spinal Block Patient position: sitting Prep: ChloraPrep and site prepped and draped Patient monitoring: heart rate, continuous pulse ox, blood pressure and cardiac monitor Approach: midline Location: L4-5 Injection technique: single-shot Needle Needle type: Whitacre and Introducer  Needle gauge: 24 G Needle length: 9 cm Assessment Events: CSF return Additional Notes Negative paresthesia. Negative blood return. Positive free-flowing CSF. Expiration date of kit checked and confirmed. Patient tolerated procedure well, without complications.

## 2023-12-10 NOTE — Progress Notes (Addendum)
 Labor Progress Note   ASSESSMENT/PLAN   Sally Hall 33 y.o.   G2P1001  at [redacted]w[redacted]d here with labor. She has made minimal progress with pushing & contractions have spaced out. Cesarean delivery has been again recommended given lack of progress and concern for malposition. Risks for fetal wellbeing have been reiterated by myself & Dr. Logan Bores at the bedside. Marchelle Folks and Gerilyn Pilgrim request time to discuss.  FWB:  - Fetal well being assessed:   Cat II, variables     GBS: - GBS negative  LABOR: - Now in 2nd stage labor, no descent after 2h of pushing - Pain Management: labor support without medications - Discussed options with patient and she and her husband will discuss C/S    Principal Problem:   Malpresentation of fetus   SUBJECTIVE/OBJECTIVE   SUBJECTIVE:  Tired, frustrated at lack of progress.   OBJECTIVE: Vital Signs: Patient Vitals for the past 12 hrs:  BP Temp Temp src Pulse Resp  12/10/23 1525 (!) 153/76 -- -- 99 --  12/10/23 1445 -- 97.9 F (36.6 C) Axillary -- 18    Last SVE:  Dilation: Lip/rim Effacement (%): 80 Cervical Position: Middle Station: -2 Presentation: Double Footling Breech Exam by:: Keitha Butte, CNM -  , Rupture Date: 12/10/23, Rupture Time: 1230,    FHR:   - Mode: External  - Baseline Rate (A): 150 bpm (fht)  -    - Characteristics (ie - accels, decels): Accelerations: 15 x 15  -    UTERINE ACTIVITY:   - Mode: Toco  - Contraction Frequency (min): 2-4 minutes

## 2023-12-10 NOTE — H&P (Signed)
 [facility] Labor & Delivery  History and Physical   HPI   Chief Complaint: fetal malpresentation  Sally Hall is a 33 y.o. G2P1001 at [redacted]w[redacted]d who presents from home in labor after baby found to be breech in labor by her homebirth CNM. Reports early & regular prenatal care, prior pregnancy she delivered vaginally at term a 7#2oz baby.   Prenatal care began at 15w. TWG:26lb EFW by Leopolds 7# Breech on ultrasound, footling breech on exam.  Pregnancy Complications Patient Active Problem List   Diagnosis Date Noted   Malpresentation of fetus 12/10/2023   Normal vaginal delivery 07/15/2020   Pulmonary stenosis 07/15/2020    Review of Systems A twelve point review of systems was negative except as stated in HPI.   HISTORY   Medications Medications Prior to Admission  Medication Sig Dispense Refill Last Dose/Taking   magnesium 30 MG tablet Take 30 mg by mouth 2 (two) times daily.   12/09/2023   Prenatal Vit-Fe Fumarate-FA (PRENATAL MULTIVITAMIN) TABS tablet Take 1 tablet by mouth daily at 12 noon.   12/09/2023    Allergies is allergic to penicillins.   OB History OB History  Gravida Para Term Preterm AB Living  2 1 1  0 0 1  SAB IAB Ectopic Multiple Live Births  0 0 0 0 1    # Outcome Date GA Lbr Len/2nd Weight Sex Type Anes PTL Lv  2 Current           1 Term 07/15/20 [redacted]w[redacted]d / 03:15 3220 g F Vag-Spont None  LIV     Name: Sally Hall     Apgar1: 8  Apgar5: 9    Past Medical History Past Medical History:  Diagnosis Date   Pulmonary stenosis     Past Surgical History Past Surgical History:  Procedure Laterality Date   NO PAST SURGERIES      Social History  reports that she has never smoked. She has never used smokeless tobacco. She reports that she does not drink alcohol and does not use drugs.   Family History family history includes Diabetes in her maternal grandfather; Healthy in her father and mother.   PHYSICAL EXAM    There were no vitals filed for this visit.  Constitutional: No acute distress, well appearing, and well nourished. Neurologic: She is alert and conversational.  Psychiatric: She has a normal mood and affect.  Musculoskeletal: Normal gait, grossly normal range of motion Cardiovascular: Normal rate.   Pulmonary/Chest: Normal work of breathing.  Gastrointestinal/Abdominal: Soft. Gravid. There is no tenderness.  Skin: Skin is warm and dry. No rash noted.  Genitourinary: Normal external female genitalia.  SVE:   Dilation: 8 Station: -2 Presentation: Single Footling Breech Exam by:: Keitha Butte, CNM   NST Interpretation Indication: labor Baseline: 150 bpm Variability: moderate Accelerations: present Decelerations: none Contractions: regular, every 3-5 minutes Time noted:  See OBIX Impression: reactive Authenticated by: Dominica Severin    PRENATAL LABS FROM OB RESULTS CONSOLE  ABO, Rh: --/--/AB POS (03/20 1333) Antibody: NEG (03/20 1333) Rubella: Nonimmune, Equivocal (10/31 0000) RPR:   pending HBsAg:   ordered HIV:   ordered UEA:[id number]/-- (02/25 0000)  ASSESSMENT AND PLAN   Sally Hall is a 33 y.o. G2P1001 at [redacted]w[redacted]d with EDD: 12/12/2023, by Last Menstrual Period admitted for active labor. Risks of vaginal breech birth including cord prolapse, fetal head entrapment, asphyxiation and fetal death discussed with Addasyn, her husband Gerilyn Pilgrim & her homebirth team at the bedside. Recommendation for  primary Cesarean due to breech presentation reviewed with patient & support team. She desires time to think prior to decision. Dr. Logan Bores called to bedside and notified of patient exam and fetal presentation.  Geraldean is undecided regarding route of birth and requests time to pray and decide.  Fetal Status: - breech footling presentation by BSUS & SVE - EFW: 7# by Leopolds - CEFM - FHT currently cat I - NICU at delivery   Labs/Immunizations: TDAP: declined Flu:  declined Rubella: Equivocal Varicella immune HIV: ordered Hep B: ordered Hep C: not done RPR: pending GBS: negative  Lab Results  Component Value Date   VZVIGG 413 12/30/2019   HIV Non Reactive 12/30/2019     Postpartum Plan: - Feeding: Breast Milk - Contraception: plans undecided - Prenatal Care Provider: Mik[facility]  Attending Dr. Logan Bores was immediately available for the care of the patient.

## 2023-12-10 NOTE — Progress Notes (Signed)
 Labor Progress Note   ASSESSMENT/PLAN   Arabella Revelle 33 y.o.   G2P1001  at [redacted]w[redacted]d here with labor at term, breech presentation.  FWB:  - Fetal well being assessed:   cat II     GBS: - GBS negative  LABOR: - Urge to push now consistent - Pain Management: labor support without medications - Discussed options with patient and will begin pushing, Dr. Logan Bores aware & present on unit. - Anticipate SVD   Principal Problem:   Malpresentation of fetus   SUBJECTIVE/OBJECTIVE   SUBJECTIVE: Feeling more pressure and urge to push sitting on toilet    OBJECTIVE: Vital Signs: Patient Vitals for the past 12 hrs:  BP Temp Temp src Pulse Resp Height Weight  12/10/23 1717 -- -- -- -- -- 5\' 7"  (1.702 m) 75.8 kg  12/10/23 1525 (!) 153/76 -- -- 99 -- -- --  12/10/23 1445 -- 97.9 F (36.6 C) Axillary -- 18 -- --    Last SVE:  Dilation: Lip/rim Effacement (%): 80 Cervical Position: Middle Station: -2 Presentation: Double Footling Breech Exam by:: Keitha Butte, CNM -  , Rupture Date: 12/10/23, Rupture Time: 1230,    FHR:   - Mode: External  - Baseline Rate (A): 150 bpm (fht)  -    - Characteristics (ie - accels, decels): Accelerations: 15 x 15  -    UTERINE ACTIVITY:   - Mode: Toco  - Contraction Frequency (min): 2-4 minutes

## 2023-12-10 NOTE — Op Note (Signed)
     OP NOTE  Date: 12/10/2023   7:01 PM Name Sally Hall MR# [id number]  Preoperative Diagnosis: 1. Intrauterine pregnancy at 103w5d Principal Problem:   Malpresentation of fetus Active Problems:   Pregnancy  2.  Advanced cervical dilation - FOOTLING BREECH  Postoperative Diagnosis: 1. Intrauterine pregnancy at [redacted]w[redacted]d, delivered 2. Viable infant 3. Remainder same as pre-op   Procedure: 1. Primary Low-Transverse Cesarean Section  Surgeon: Elonda Husky, MD  Assistant:  Benjamine Mola  Anesthesia: Spinal   EBL: 425 ml     Findings: 1) female infant, Apgar scores of 8   at 1 minute and 9   at 5 minutes and a birthweight of 121.34 ounces.    2) Normal uterus, tubes and ovaries.    Procedure:  The patient was prepped and draped in the supine position and placed under spinal anesthesia.  A transverse incision was made across the abdomen in a Pfannenstiel manner. If indicated the old scar was systematically removed with sharp dissection.  We carried the dissection down to the level of the fascia.  The fascia was incised in a curvilinear manner.  The fascia was then elevated from the rectus muscles with blunt and sharp dissection.  The rectus muscles were separated laterally exposing the peritoneum.  The peritoneum was carefully entered with care being taken to avoid bowel and bladder.  A self-retaining retractor was placed.  The visceral peritoneum was incised in a curvilinear fashion across the lower uterine segment creating a bladder flap. A transverse incision was made across the lower uterine segment and extended laterally and superiorly with blunt dissection.  Artificial rupture membranes was performed and Clear fluid was noted.  The infant was delivered from the footling breech position.  A nuchal cord was not present. After an appropriate time interval, the cord was doubly clamped and cut. Cord blood was obtained if required.  The infant was handed to the pediatric  personnel  who then placed the infant under heat lamps where it was cleaned dried and suctioned as needed. The placenta was delivered. The hysterotomy incision was then identified on ring forceps.  The uterine cavity was cleaned with a moist lap sponge.  The hysterotomy incision was closed with a running interlocking suture of Vicryl.  Hemostasis was excellent.  Pitocin was run in the IV and the uterus was found to be firm. The posterior cul-de-sac and gutters were cleaned and inspected.  Hemostasis was noted.  The fascia was then closed with a running suture of #1 Vicryl.  Hemostasis of the subcutaneous tissues was obtained using the Bovie.  The subcutaneous tissues were closed with a running suture of 000 Vicryl.  A subcuticular suture was placed.  Steri-strips were applied in the usual manner.  A Lidoderm patch was applied.  A pressure dressing was placed.  The patient went to the recovery room in stable condition. Keitha Butte CNM provided exposure, dissection, suctioning, retraction, and general support and assistance during the procedure.   Elonda Husky, M.D. 12/10/2023 7:01 PM

## 2023-12-10 NOTE — Progress Notes (Signed)
 Anesthesiology- I approached patient to answer questions regarding spinal and general anesthesia. Her questions regarding options and risks were answered with no further questions.  -Nelta Numbers MD Anesthesiology

## 2023-12-10 NOTE — Progress Notes (Signed)
 OB attending note. Patient presented to labor and delivery after being found to be breech at home by her home birth midwife.  Patient noted to be in active labor-8 cm at the time of presentation. Ms. Weissberg and her partner were extensively counseled regarding the recommendation of cesarean delivery.  Special emphasis was placed on the footling breech presentation.  The risks and benefits of vaginal birth and cesarean delivery in regard to this presentation were discussed.  Emphasis on the possibility of head entrapment.  They are aware that this could result in neurologic damage to the baby or even fetal death.  They were made aware that cesarean delivery was the recommended option based on statistical fetal outcomes. Over the course of more than an hour they were encouraged to make a decision of vaginal birth versus cesarean and they have declined making this decision.  They were made aware that no decision was truly making the decision for vaginal birth against recommendations. She is currently assumed to be completely dilated as she is feeling pressure and pushing with contractions at this point.  Bulging amniotic sac is noted to be present at the perineum. Will expect a vaginal birth of footling breech.

## 2023-12-10 NOTE — Significant Event (Signed)
 Sally Hall continues to feel uncertain regarding route of delivery. Reviewed that risks to her for Cesarean birth (bleeding, infection) rise as labor progresses. As well as increased need for general anesthesia, especially in an urgent or emergent situation. She and Gerilyn Pilgrim verbalize understanding, denies questions at this time.

## 2023-12-10 NOTE — Transfer of Care (Signed)
 Immediate Anesthesia Transfer of Care Note  Patient: Sally Hall  Procedure(s) Performed: CESAREAN DELIVERY  Patient Location: Mother/Baby  Anesthesia Type:Spinal  Level of Consciousness: awake, alert , and oriented  Airway & Oxygen Therapy: Patient Spontanous Breathing  Post-op Assessment: Report given to RN and Post -op Vital signs reviewed and stable  Post vital signs: Reviewed  Last Vitals:  Vitals Value Taken Time  BP 125/78   Temp 97.32f   Pulse 85   Resp 12   SpO2 99     Last Pain:  Vitals:   12/10/23 1445  TempSrc: Axillary  PainSc:       Patients Stated Pain Goal: 0 (12/10/23 1300)  Complications: No notable events documented.

## 2023-12-10 NOTE — Anesthesia Preprocedure Evaluation (Signed)
 Anesthesia Evaluation  Patient identified by MRN, date of birth, ID band Patient awake    Reviewed: Allergy & Precautions, NPO status , Patient's Chart, lab work & pertinent test results  History of Anesthesia Complications Negative for: history of anesthetic complications  Airway Mallampati: III  TM Distance: >3 FB Neck ROM: full    Dental no notable dental hx.    Pulmonary neg pulmonary ROS   Pulmonary exam normal        Cardiovascular Exercise Tolerance: Good Normal cardiovascular exam+ Valvular Problems/Murmurs (patient reports history of PS as a child, she followed wth crdiology but was released once she went to college, she denies any assosiaceted syptoms like decreased exercise tolerance, DOE, syncope, lightheadedness dizziness)      Neuro/Psych    GI/Hepatic negative GI ROS,,,  Endo/Other    Renal/GU   negative genitourinary   Musculoskeletal   Abdominal   Peds  Hematology negative hematology ROS (+)   Anesthesia Other Findings Past Medical History: No date: Pulmonary stenosis  Past Surgical History: No date: NO PAST SURGERIES  BMI    Body Mass Index: 26.16 kg/m      Reproductive/Obstetrics (+) Pregnancy                             Anesthesia Physical Anesthesia Plan  ASA: 2  Anesthesia Plan: Spinal   Post-op Pain Management: Toradol IV (intra-op)* and Ofirmev IV (intra-op)*   Induction:   PONV Risk Score and Plan:   Airway Management Planned: Natural Airway and Nasal Cannula  Additional Equipment:   Intra-op Plan:   Post-operative Plan:   Informed Consent: I have reviewed the patients History and Physical, chart, labs and discussed the procedure including the risks, benefits and alternatives for the proposed anesthesia with the patient or authorized representative who has indicated his/her understanding and acceptance.     Dental Advisory Given  Plan  Discussed with: Anesthesiologist  Anesthesia Plan Comments: (Patient reports no bleeding problems and no anticoagulant use.  Plan for spinal with backup GA  Patient consented for risks of anesthesia including but not limited to:  - adverse reactions to medications - damage to eyes, teeth, lips or other oral mucosa - nerve damage due to positioning  - risk of bleeding, infection and or nerve damage from spinal that could lead to paralysis - risk of headache or failed spinal - damage to teeth, lips or other oral mucosa - sore throat or hoarseness - damage to heart, brain, nerves, lungs, other parts of body or loss of life  Patient voiced understanding and assent.)       Anesthesia Quick Evaluation

## 2023-12-11 ENCOUNTER — Encounter: Payer: Self-pay | Admitting: Obstetrics and Gynecology

## 2023-12-11 DIAGNOSIS — Z98891 History of uterine scar from previous surgery: Principal | ICD-10-CM

## 2023-12-11 LAB — CBC
HCT: 26.6 % — ABNORMAL LOW (ref 36.0–46.0)
Hemoglobin: 8.9 g/dL — ABNORMAL LOW (ref 12.0–15.0)
MCH: 25.4 pg — ABNORMAL LOW (ref 26.0–34.0)
MCHC: 33.5 g/dL (ref 30.0–36.0)
MCV: 76 fL — ABNORMAL LOW (ref 80.0–100.0)
Platelets: 225 10*3/uL (ref 150–400)
RBC: 3.5 MIL/uL — ABNORMAL LOW (ref 3.87–5.11)
RDW: 13.2 % (ref 11.5–15.5)
WBC: 22 10*3/uL — ABNORMAL HIGH (ref 4.0–10.5)
nRBC: 0 % (ref 0.0–0.2)

## 2023-12-11 LAB — HIV ANTIBODY (ROUTINE TESTING W REFLEX): HIV Screen 4th Generation wRfx: NONREACTIVE

## 2023-12-11 LAB — OB RESULTS CONSOLE HEPATITIS B SURFACE ANTIGEN: Hepatitis B Surface Ag: NEGATIVE

## 2023-12-11 LAB — RPR: RPR Ser Ql: NONREACTIVE

## 2023-12-11 LAB — HEPATITIS B SURFACE ANTIGEN: Hepatitis B Surface Ag: NONREACTIVE

## 2023-12-11 MED ORDER — DOCUSATE SODIUM 100 MG PO CAPS: 100.0000 mg | ORAL_CAPSULE | Freq: Two times a day (BID) | ORAL | Status: AC | PRN

## 2023-12-11 MED ORDER — IBUPROFEN 600 MG PO TABS: 600.0000 mg | ORAL_TABLET | Freq: Four times a day (QID) | ORAL | Status: AC

## 2023-12-11 MED ORDER — DOCUSATE SODIUM 100 MG PO CAPS: 100.0000 mg | ORAL_CAPSULE | Freq: Two times a day (BID) | ORAL | Status: DC | PRN

## 2023-12-11 MED ORDER — OXYCODONE-ACETAMINOPHEN 5-325 MG PO TABS: 1.0000 | ORAL_TABLET | Freq: Four times a day (QID) | ORAL | 0 refills | Status: DC | PRN

## 2023-12-11 MED ORDER — SIMETHICONE 80 MG PO CHEW: 80.0000 mg | CHEWABLE_TABLET | Freq: Four times a day (QID) | ORAL | Status: AC

## 2023-12-11 MED ORDER — MEASLES, MUMPS & RUBELLA VAC IJ SOLR
0.5000 mL | INTRAMUSCULAR | Status: DC | PRN
  Filled 2023-12-11: qty 0.5

## 2023-12-11 NOTE — Discharge Summary (Signed)
 Postpartum Discharge Summary  Date of Service updated 12/11/2023     Patient Name: Sally Hall DOB: 11-12-1990 MRN: [id number]  Date of admission: 12/10/2023 Delivery date:12/10/2023 Delivering provider: Linzie Collin Date of discharge: 12/11/2023  Admitting diagnosis: Malpresentation of fetus [O32.9XX0] Pregnancy [Z34.90] Intrauterine pregnancy: [redacted]w[redacted]d     Secondary diagnosis:  Principal Problem:   Malpresentation of fetus Active Problems:   Pregnancy  Additional problems: NA    Discharge diagnosis: Term Pregnancy Delivered                                              Post partum procedures: NA Augmentation: N/A Complications: None  Hospital course: Onset of Labor With Unplanned C/S   33 y.o. yo G2P2002 at [redacted]w[redacted]d was admitted in Active Labor on 12/10/2023 after homebirth transfer due to malpresentation found in labor. Patient had a labor course significant for malpresentation of the fetus. The patient went for cesarean section due to Malpresentation. Delivery details as follows: Membrane Rupture Time/Date: 12:30 PM,12/10/2023  Delivery Method:C-Section, Low Transverse Operative Delivery:N/A Details of operation can be found in separate operative note. Patient had a postpartum course complicated byNA.  She is ambulating,tolerating a regular diet, passing flatus, and urinating well. Pain managed with PO medications. Breastfeeding without difficulty. Mood is good. She has a lot of support at home.  Discussed Iron supplementation with Iron or Floradix. Patient is discharged home in stable condition 12/11/23.  Newborn Data: Birth date:12/10/2023 Birth time:6:15 PM Gender:Female Living status:Living Apgars:8 ,9  Weight:3440 g  Magnesium Sulfate received: No BMZ received: No Rhophylac:N/A MMR:No declined  T-DaP: declined Flu: declined RSV Vaccine received: No Transfusion:No Immunizations administered: There is no immunization history for the selected administration  types on file for this patient.  Physical exam  Vitals:   12/11/23 1300 12/11/23 1400 12/11/23 1500 12/11/23 1600  BP:      Pulse: 100 (!) 105 85 96  Resp:      Temp:      TempSrc:      SpO2: 95% 96% 97% 98%  Weight:      Height:       General: alert and cooperative tired appearing Lungs: CTAB Heart: RRR, no skips, gallops or murmurs Breasts: soft no redness or masses. Nipples slightly erect and intact bilaterally.  Lochia: appropriate Uterine Fundus: firm Incision: Dressing is clean, dry, and intact DVT Evaluation: No evidence of DVT seen on physical exam. Negative Homan's sign. Trace BLE  Labs: Lab Results  Component Value Date   WBC 22.0 (H) 12/11/2023   HGB 8.9 (L) 12/11/2023   HCT 26.6 (L) 12/11/2023   MCV 76.0 (L) 12/11/2023   PLT 225 12/11/2023      Latest Ref Rng & Units 03/11/2017    4:49 PM  CMP  Glucose 65 - 99 mg/dL 409   BUN 6 - 20 mg/dL 10   Creatinine 8.11 - 1.00 mg/dL 9.14   Sodium 782 - 956 mmol/L 133   Potassium 3.5 - 5.1 mmol/L 3.1   Chloride 101 - 111 mmol/L 100   CO2 22 - 32 mmol/L 25   Calcium 8.9 - 10.3 mg/dL 9.0   Total Protein 6.5 - 8.1 g/dL 7.6   Total Bilirubin 0.3 - 1.2 mg/dL 0.7   Alkaline Phos 38 - 126 U/L 59   AST 15 - 41 U/L 20  ALT 14 - 54 U/L 12    Edinburgh Score:    12/11/2023    8:05 AM  Edinburgh Postnatal Depression Scale Screening Tool  I have been able to laugh and see the funny side of things. 0  I have looked forward with enjoyment to things. 0  I have blamed myself unnecessarily when things went wrong. 0  I have been anxious or worried for no good reason. 0  I have felt scared or panicky for no good reason. 0  Things have been getting on top of me. 0  I have been so unhappy that I have had difficulty sleeping. 0  I have felt sad or miserable. 0  I have been so unhappy that I have been crying. 0  The thought of harming myself has occurred to me. 0  Edinburgh Postnatal Depression Scale Total 0      After  visit meds:  Allergies as of 12/11/2023       Reactions   Penicillins         Medication List     TAKE these medications    docusate sodium 100 MG capsule Commonly known as: COLACE Take 1 capsule (100 mg total) by mouth 2 (two) times daily as needed for mild constipation.   ibuprofen 600 MG tablet Commonly known as: ADVIL Take 1 tablet (600 mg total) by mouth every 6 (six) hours.   magnesium 30 MG tablet Take 30 mg by mouth 2 (two) times daily.   oxyCODONE-acetaminophen 5-325 MG tablet Commonly known as: PERCOCET/ROXICET Take 1-2 tablets by mouth every 6 (six) hours as needed for moderate pain (pain score 4-6).   prenatal multivitamin Tabs tablet Take 1 tablet by mouth daily at 12 noon.   simethicone 80 MG chewable tablet Commonly known as: MYLICON Chew 1 tablet (80 mg total) by mouth 4 (four) times daily.               Discharge Care Instructions  (From admission, onward)           Start     Ordered   12/11/23 0000  Discharge wound care:       Comments: SHOWER DAILY Wash incision gently with soap and water.  Call office with any drainage, redness, or firmness of the incision.   12/11/23 1851             Discharge home in stable condition Infant Feeding: Breast Infant Disposition:home with mother Discharge instruction: per After Visit Summary and Postpartum booklet. Activity: Advance as tolerated. Pelvic rest for 6 weeks.  Diet: routine diet Anticipated Birth Control: Condoms Postpartum Appointment:1 week Additional Postpartum F/U: Incision check 1 week Future Appointments:No future appointments. Follow up Visit:  Follow-up Information     Linzie Collin, MD. Schedule an appointment as soon as possible for a visit in 1 week(s).   Specialties: Obstetrics and Gynecology, Radiology Why: incision check Contact information: [address] [place] [address] [phone]                     12/11/2023 Ellouise Newer  [facility], CNM

## 2023-12-11 NOTE — Progress Notes (Signed)
 Patient had questions/concerns about pain medication. Explained to patient all pain medication options and answered any questions she had. Educated patient on the importance of staying ahead of pain instead of playing catch up. Patient verbalized understanding. Patient to let us know if or when she wants pain medication.

## 2023-12-11 NOTE — Progress Notes (Signed)
 Patient d/c home with infant. D/c instructions, Rx, and f/u appt given to and reviewed with pt. Pt verbalized understanding. Escorted out by staff.

## 2023-12-11 NOTE — Discharge Instructions (Signed)

## 2023-12-11 NOTE — Lactation Note (Signed)
 This note was copied from a baby's chart. Lactation Consultation Note  Patient Name: Girl Aki Burdin [id number] Date: 12/11/2023 Age:34 hours     Maternal Data  Maternal pt, support person, and infant sleeping upon LC room entry. Pt reports feedings, voids, and stooling from infant are all going well with pain levels low and no known issues or concerns regarding feeding to report. LC advised to contact if assessment of latch and/or supervised feed is requested, pt expressed understanding and is hoping to discharge back to home before EOB. Will reach out to inpatient [facility] or outpatient facility if anything should change or assistance is needed.   Feeding  No feeding or pumping observed due to family unit resting during entry time.      Discharge  Discuss engorgement management and signs of mastitis as well as infant feeding cues, normative infant behavior, warning signs indicative of pediatrician intervention, and outpatient referral process should patient request further assistance or education prior to discharge.   Consult Status   PRN   Lennie Hummer 12/11/2023, 3:32 PM

## 2023-12-11 NOTE — Progress Notes (Signed)
 Patient ID: Sally Hall, female   DOB: February 28, 1991, 33 y.o.   MRN: [id number]    Progress Note - Cesarean Delivery  Sally Hall is a 33 y.o. G2P2002 now PP day 1 s/p C-Section, Low Transverse.   Subjective:  Patient reports no problems with eating, bowel movements, voiding, or their wound  She is breastfeeding  Objective:  Vital signs in last 24 hours: Temp:  [97.9 F (36.6 C)-99 F (37.2 C)] 98.5 F (36.9 C) (03/21 0801) Pulse Rate:  [62-99] 77 (03/21 0801) Resp:  [17-20] 20 (03/21 0801) BP: (92-153)/(57-77) 97/66 (03/21 0801) SpO2:  [94 %-100 %] 99 % (03/21 0801) Weight:  [75.8 kg] 75.8 kg (03/20 1717)  Physical Exam:  General: alert, cooperative, and no distress Lochia: appropriate Uterine Fundus: firm Incision: dressing intact dry    Data Review Recent Labs    12/10/23 1333 12/11/23 0330  HGB 11.2* 8.9*  HCT 34.5* 26.6*    Assessment:  Principal Problem:   Malpresentation of fetus Active Problems:   Pregnancy   Status post Cesarean section. Doing well postoperatively.     Plan:       Continue current care. OOB Pt may shower Dressing change later today PO pain meds as needed  Probable discharge tomorrow AM  Elonda Husky, M.D. 12/11/2023 10:52 AM

## 2023-12-11 NOTE — Anesthesia Postprocedure Evaluation (Signed)
 Anesthesia Post Note  Patient: Sally Hall  Procedure(s) Performed: CESAREAN DELIVERY  Patient location during evaluation: Mother Baby Anesthesia Type: Spinal Level of consciousness: oriented and awake and alert Pain management: pain level controlled Vital Signs Assessment: post-procedure vital signs reviewed and stable Respiratory status: spontaneous breathing and respiratory function stable Cardiovascular status: blood pressure returned to baseline and stable Postop Assessment: no headache, no backache, no apparent nausea or vomiting and able to ambulate Anesthetic complications: no  No notable events documented.   Last Vitals:  Vitals:   12/11/23 0600 12/11/23 0801  BP:  97/66  Pulse: 79 77  Resp:  20  Temp:  36.9 C  SpO2: 97% 99%    Last Pain:  Vitals:   12/11/23 0430  TempSrc: Oral  PainSc:                  Sally Hall

## 2023-12-12 ENCOUNTER — Other Ambulatory Visit: Payer: Self-pay

## 2023-12-16 ENCOUNTER — Ambulatory Visit (INDEPENDENT_AMBULATORY_CARE_PROVIDER_SITE_OTHER): Payer: Self-pay | Admitting: Obstetrics and Gynecology

## 2023-12-16 ENCOUNTER — Encounter: Payer: Self-pay | Admitting: Obstetrics and Gynecology

## 2023-12-16 VITALS — BP 117/94 | HR 93 | Ht 67.0 in | Wt 149.9 lb

## 2023-12-16 DIAGNOSIS — Z9889 Other specified postprocedural states: Secondary | ICD-10-CM

## 2023-12-16 NOTE — Progress Notes (Signed)
 HPI:      [name] is a 33 y.o. [id number] who LMP was Patient's last menstrual period was 03/07/2023 (exact date).  Subjective:   She presents today 1 week from cesarean delivery for breech presentation.  She reports she is doing well.  States that her pain has significantly subsided.  She reports minimal vaginal bleeding.  She is breast-feeding full-time.  She is taking ibuprofen and Tylenol for pain.    Hx: The following portions of the patient's history were reviewed and updated as appropriate:             She  has a past medical history of Pulmonary stenosis. She does not have any pertinent problems on file. She  has a past surgical history that includes No past surgeries and Cesarean section (12/10/2023). Her family history includes Diabetes in her maternal grandfather; Healthy in her father and mother. She  reports that she has never smoked. She has never used smokeless tobacco. She reports that she does not drink alcohol and does not use drugs. She has a current medication list which includes the following prescription(s): docusate sodium, ibuprofen, magnesium, and simethicone. She is allergic to penicillins.       Review of Systems:  Review of Systems  Constitutional: Denied constitutional symptoms, night sweats, recent illness, fatigue, fever, insomnia and weight loss.  Eyes: Denied eye symptoms, eye pain, photophobia, vision change and visual disturbance.  Ears/Nose/Throat/Neck: Denied ear, nose, throat or neck symptoms, hearing loss, nasal discharge, sinus congestion and sore throat.  Cardiovascular: Denied cardiovascular symptoms, arrhythmia, chest pain/pressure, edema, exercise intolerance, orthopnea and palpitations.  Respiratory: Denied pulmonary symptoms, asthma, pleuritic pain, productive sputum, cough, dyspnea and wheezing.  Gastrointestinal: Denied, gastro-esophageal reflux, melena, nausea and vomiting.  Genitourinary: Denied genitourinary symptoms including  symptomatic vaginal discharge, pelvic relaxation issues, and urinary complaints.  Musculoskeletal: Denied musculoskeletal symptoms, stiffness, swelling, muscle weakness and myalgia.  Dermatologic: Denied dermatology symptoms, rash and scar.  Neurologic: Denied neurology symptoms, dizziness, headache, neck pain and syncope.  Psychiatric: Denied psychiatric symptoms, anxiety and depression.  Endocrine: Denied endocrine symptoms including hot flashes and night sweats.   Meds:   Current Outpatient Medications on File Prior to Visit  Medication Sig Dispense Refill   docusate sodium (COLACE) 100 MG capsule Take 1 capsule (100 mg total) by mouth 2 (two) times daily as needed for mild constipation.     ibuprofen (ADVIL) 600 MG tablet Take 1 tablet (600 mg total) by mouth every 6 (six) hours.     magnesium 30 MG tablet Take 30 mg by mouth 2 (two) times daily.     simethicone (MYLICON) 80 MG chewable tablet Chew 1 tablet (80 mg total) by mouth 4 (four) times daily.     No current facility-administered medications on file prior to visit.      Objective:     Vitals:   12/16/23 1107  BP: (!) 117/94  Pulse: 93   Filed Weights   12/16/23 1107  Weight: 149 lb 14.4 oz (68 kg)    Dressing removed           Abdomen: Soft.  Non-tender.  No masses.  No HSM.  Incision/s: Intact.  Healing well.  No erythema.  No drainage.             Assessment:    G2P2002 Patient Active Problem List   Diagnosis Date Noted   S/P C-section 12/11/2023   Malpresentation of fetus 12/10/2023   Pregnancy 12/10/2023   Normal  vaginal delivery 07/15/2020   Pulmonary stenosis 07/15/2020     1. Postoperative state   2. Postpartum care following cesarean delivery     Patient doing well postop   Plan:            1.  May resume normal activities with exception of heavy lifting and nothing in the vagina.  2.  Wound care discussed. Orders No orders of the defined types were placed in this encounter.   No  orders of the defined types were placed in this encounter.     F/U  Return in about 5 weeks (around 01/20/2024).  Elonda Husky, M.D. 12/16/2023 11:40 AM

## 2023-12-16 NOTE — Progress Notes (Signed)
 Patient presents today for 1 week postpartum follow-up. Patient had a cesarean delivery on 12/10/23. Mom reports baby is breast feeding well, pumping once daily. She states she would like condoms for birth control. EPDS score of 0. She states no other questions or concerns at this time.

## 2024-01-01 ENCOUNTER — Other Ambulatory Visit: Payer: Self-pay | Admitting: Emergency Medicine

## 2024-01-01 DIAGNOSIS — I82409 Acute embolism and thrombosis of unspecified deep veins of unspecified lower extremity: Secondary | ICD-10-CM

## 2024-01-05 ENCOUNTER — Ambulatory Visit
Admission: RE | Admit: 2024-01-05 | Discharge: 2024-01-05 | Disposition: A | Discharge status: Home / Self Care | Payer: Self-pay | Source: Ambulatory Visit | Attending: Emergency Medicine | Admitting: Emergency Medicine

## 2024-01-05 DIAGNOSIS — I82409 Acute embolism and thrombosis of unspecified deep veins of unspecified lower extremity: Secondary | ICD-10-CM
